# Patient Record
Sex: Male | Born: 1970 | Race: White | Hispanic: No | Marital: Single | Smoking: Current every day smoker
Health system: Southern US, Community
[De-identification: ages and names within clinical notes are randomized; demographics above are authoritative.]

## PROBLEM LIST (undated history)

## (undated) DIAGNOSIS — K5792 Diverticulitis of intestine, part unspecified, without perforation or abscess without bleeding: Secondary | ICD-10-CM

## (undated) DIAGNOSIS — N200 Calculus of kidney: Secondary | ICD-10-CM

## (undated) HISTORY — PX: BACK SURGERY: SHX140

## (undated) HISTORY — PX: KNEE ARTHROSCOPY: SUR90

## (undated) HISTORY — PX: HERNIA REPAIR: SHX51

---

## 1999-11-19 ENCOUNTER — Emergency Department (HOSPITAL_COMMUNITY): Admission: EM | Admit: 1999-11-19 | Discharge: 1999-11-19 | Payer: Self-pay | Admitting: *Deleted

## 2006-06-15 ENCOUNTER — Ambulatory Visit: Payer: Self-pay | Admitting: Infectious Diseases

## 2006-06-15 ENCOUNTER — Inpatient Hospital Stay (HOSPITAL_COMMUNITY): Admission: EM | Admit: 2006-06-15 | Discharge: 2006-06-17 | Payer: Self-pay | Admitting: Emergency Medicine

## 2006-06-27 ENCOUNTER — Ambulatory Visit: Payer: Self-pay | Admitting: Gastroenterology

## 2006-06-27 ENCOUNTER — Inpatient Hospital Stay (HOSPITAL_COMMUNITY): Admission: AD | Admit: 2006-06-27 | Discharge: 2006-07-04 | Payer: Self-pay | Admitting: Gastroenterology

## 2006-06-28 ENCOUNTER — Encounter (INDEPENDENT_AMBULATORY_CARE_PROVIDER_SITE_OTHER): Payer: Self-pay | Admitting: Specialist

## 2006-07-06 ENCOUNTER — Ambulatory Visit: Payer: Self-pay | Admitting: Internal Medicine

## 2006-08-08 ENCOUNTER — Ambulatory Visit: Payer: Self-pay | Admitting: Gastroenterology

## 2006-08-08 LAB — CONVERTED CEMR LAB
AST: 21 units/L (ref 0–37)
Albumin: 3.5 g/dL (ref 3.5–5.2)
Alkaline Phosphatase: 59 units/L (ref 39–117)
BUN: 18 mg/dL (ref 6–23)
Basophils Absolute: 0.1 10*3/uL (ref 0.0–0.1)
Creatinine, Ser: 1.1 mg/dL (ref 0.4–1.5)
GFR calc Af Amer: 97 mL/min
HCT: 47.4 % (ref 39.0–52.0)
MCHC: 34.6 g/dL (ref 30.0–36.0)
MCV: 88.1 fL (ref 78.0–100.0)
Monocytes Relative: 1.2 % — ABNORMAL LOW (ref 3.0–11.0)
Neutrophils Relative %: 93.5 % — ABNORMAL HIGH (ref 43.0–77.0)
Platelets: 303 10*3/uL (ref 150–400)
RBC: 5.38 M/uL (ref 4.22–5.81)
RDW: 13.8 % (ref 11.5–14.6)
Sodium: 140 meq/L (ref 135–145)
Total Bilirubin: 1 mg/dL (ref 0.3–1.2)
Total Protein: 7 g/dL (ref 6.0–8.3)

## 2006-09-21 ENCOUNTER — Ambulatory Visit: Payer: Self-pay | Admitting: Gastroenterology

## 2006-10-03 ENCOUNTER — Ambulatory Visit: Payer: Self-pay | Admitting: Gastroenterology

## 2006-10-03 LAB — CONVERTED CEMR LAB
Basophils Absolute: 0 10*3/uL (ref 0.0–0.1)
Bilirubin, Direct: 0.2 mg/dL (ref 0.0–0.3)
Eosinophils Absolute: 0 10*3/uL (ref 0.0–0.6)
Eosinophils Relative: 0.3 % (ref 0.0–5.0)
HCT: 44.4 % (ref 39.0–52.0)
Lymphocytes Relative: 11.8 % — ABNORMAL LOW (ref 12.0–46.0)
MCHC: 34 g/dL (ref 30.0–36.0)
MCV: 94.3 fL (ref 78.0–100.0)
Neutro Abs: 8.6 10*3/uL — ABNORMAL HIGH (ref 1.4–7.7)
Neutrophils Relative %: 84.9 % — ABNORMAL HIGH (ref 43.0–77.0)
Total Protein: 7 g/dL (ref 6.0–8.3)
WBC: 10.1 10*3/uL (ref 4.5–10.5)

## 2006-10-07 ENCOUNTER — Emergency Department (HOSPITAL_COMMUNITY): Admission: EM | Admit: 2006-10-07 | Discharge: 2006-10-07 | Payer: Self-pay | Admitting: Emergency Medicine

## 2006-10-13 ENCOUNTER — Ambulatory Visit: Payer: Self-pay | Admitting: Gastroenterology

## 2006-10-17 ENCOUNTER — Emergency Department (HOSPITAL_COMMUNITY): Admission: EM | Admit: 2006-10-17 | Discharge: 2006-10-17 | Payer: Self-pay | Admitting: Emergency Medicine

## 2006-11-09 ENCOUNTER — Ambulatory Visit: Payer: Self-pay | Admitting: Gastroenterology

## 2006-11-09 LAB — CONVERTED CEMR LAB
Basophils Relative: 0.9 % (ref 0.0–1.0)
Eosinophils Relative: 1.2 % (ref 0.0–5.0)
HCT: 47 % (ref 39.0–52.0)
Hemoglobin: 16 g/dL (ref 13.0–17.0)
Monocytes Absolute: 0.5 10*3/uL (ref 0.2–0.7)
Neutrophils Relative %: 67.8 % (ref 43.0–77.0)
RDW: 12.9 % (ref 11.5–14.6)
WBC: 6.3 10*3/uL (ref 4.5–10.5)

## 2006-12-05 ENCOUNTER — Emergency Department (HOSPITAL_COMMUNITY): Admission: EM | Admit: 2006-12-05 | Discharge: 2006-12-05 | Payer: Self-pay | Admitting: Emergency Medicine

## 2007-03-07 ENCOUNTER — Emergency Department (HOSPITAL_COMMUNITY): Admission: EM | Admit: 2007-03-07 | Discharge: 2007-03-07 | Payer: Self-pay | Admitting: Emergency Medicine

## 2007-07-06 ENCOUNTER — Emergency Department (HOSPITAL_COMMUNITY): Admission: EM | Admit: 2007-07-06 | Discharge: 2007-07-06 | Payer: Self-pay | Admitting: Emergency Medicine

## 2007-08-17 ENCOUNTER — Emergency Department (HOSPITAL_COMMUNITY): Admission: EM | Admit: 2007-08-17 | Discharge: 2007-08-17 | Payer: Self-pay | Admitting: Emergency Medicine

## 2007-10-16 ENCOUNTER — Emergency Department (HOSPITAL_COMMUNITY): Admission: EM | Admit: 2007-10-16 | Discharge: 2007-10-16 | Payer: Self-pay | Admitting: Emergency Medicine

## 2007-10-22 ENCOUNTER — Emergency Department (HOSPITAL_COMMUNITY): Admission: EM | Admit: 2007-10-22 | Discharge: 2007-10-22 | Payer: Self-pay | Admitting: Emergency Medicine

## 2008-04-10 ENCOUNTER — Emergency Department (HOSPITAL_COMMUNITY): Admission: EM | Admit: 2008-04-10 | Discharge: 2008-04-11 | Payer: Self-pay | Admitting: Emergency Medicine

## 2008-08-21 ENCOUNTER — Emergency Department (HOSPITAL_COMMUNITY): Admission: EM | Admit: 2008-08-21 | Discharge: 2008-08-21 | Payer: Self-pay | Admitting: Emergency Medicine

## 2010-06-24 ENCOUNTER — Inpatient Hospital Stay (HOSPITAL_COMMUNITY)
Admission: EM | Admit: 2010-06-24 | Discharge: 2010-06-25 | Payer: Self-pay | Source: Home / Self Care | Attending: Internal Medicine | Admitting: Internal Medicine

## 2010-09-28 LAB — CULTURE, BLOOD (ROUTINE X 2): Culture  Setup Time: 201112082124

## 2010-09-28 LAB — DIFFERENTIAL
Basophils Relative: 1 % (ref 0–1)
Eosinophils Absolute: 0.5 10*3/uL (ref 0.0–0.7)
Lymphs Abs: 2.4 10*3/uL (ref 0.7–4.0)
Neutrophils Relative %: 53 % (ref 43–77)

## 2010-09-28 LAB — GLUCOSE, CAPILLARY
Glucose-Capillary: 113 mg/dL — ABNORMAL HIGH (ref 70–99)
Glucose-Capillary: 122 mg/dL — ABNORMAL HIGH (ref 70–99)

## 2010-09-28 LAB — COMPREHENSIVE METABOLIC PANEL
ALT: 26 U/L (ref 0–53)
AST: 22 U/L (ref 0–37)
Albumin: 3.2 g/dL — ABNORMAL LOW (ref 3.5–5.2)
BUN: 18 mg/dL (ref 6–23)
CO2: 25 mEq/L (ref 19–32)
Calcium: 8.7 mg/dL (ref 8.4–10.5)
Calcium: 8.8 mg/dL (ref 8.4–10.5)
Creatinine, Ser: 1.02 mg/dL (ref 0.4–1.5)
GFR calc Af Amer: >60 mL/min (ref >60)
GFR calc non Af Amer: >60 mL/min (ref >60)
Glucose, Bld: 114 mg/dL — ABNORMAL HIGH (ref 70–99)
Sodium: 138 mEq/L (ref 135–145)
Total Protein: 6.2 g/dL (ref 6.0–8.3)

## 2010-09-28 LAB — C-REACTIVE PROTEIN: CRP: 0.1 mg/dL — ABNORMAL LOW (ref <0.6)

## 2010-09-28 LAB — URINALYSIS, ROUTINE W REFLEX MICROSCOPIC
Protein, ur: NEGATIVE mg/dL
Urobilinogen, UA: 0.2 mg/dL (ref 0.0–1.0)

## 2010-09-28 LAB — SEDIMENTATION RATE: Sed Rate: 7 mm/hr (ref 0–16)

## 2010-09-28 LAB — CBC
HCT: 42.8 % (ref 39.0–52.0)
Hemoglobin: 15.3 g/dL (ref 13.0–17.0)
MCH: 29.1 pg (ref 26.0–34.0)
MCH: 29.7 pg (ref 26.0–34.0)
MCHC: 34.6 g/dL (ref 30.0–36.0)
MCHC: 35.7 g/dL (ref 30.0–36.0)
MCV: 82.9 fL (ref 78.0–100.0)
Platelets: 257 10*3/uL (ref 150–400)
RBC: 5.09 MIL/uL (ref 4.22–5.81)

## 2010-09-28 LAB — FECAL LACTOFERRIN, QUANT: Fecal Lactoferrin: POSITIVE

## 2010-09-28 LAB — OVA AND PARASITE EXAMINATION

## 2010-09-28 LAB — HEMOCCULT GUIAC POC 1CARD (OFFICE): Fecal Occult Bld: POSITIVE

## 2010-11-02 LAB — URINALYSIS, ROUTINE W REFLEX MICROSCOPIC
Glucose, UA: NEGATIVE mg/dL
Ketones, ur: NEGATIVE mg/dL
Nitrite: NEGATIVE
Protein, ur: NEGATIVE mg/dL

## 2010-11-02 LAB — RAPID URINE DRUG SCREEN, HOSP PERFORMED
Benzodiazepines: NOT DETECTED
Tetrahydrocannabinol: NOT DETECTED

## 2010-11-30 NOTE — Letter (Signed)
Dec 05, 2006    Connor Mcpherson  [address]  [place], [place]  [address]   RE:  Connor Mcpherson, Connor Mcpherson  MRN:  [id number]  /  DOB:  1970/08/18   Dear Mr. Regala:   I am withdrawing from your gastroenterology care at this time because of  non-compliance and failure to keep your office appointments.  It is not  feasible for [place] to manage your rather severe inflammatory bowel disease  in this situation especially with immunosuppressive therapy which  requires close monitorization.  This is not the best situation for you  or for your physicians.  I would suggest that you go to [facility]  here in [place] to get your medications and followup or else return  to care at [facility] where you  have previously been seen.   We will not renew your medications at this time, especially with the  risk of immunosuppressive therapy unsupervised care.  We would be glad  to forward you the name of any other competent physicians in this area  you so need.    Sincerely,      Vania Rea. Jarold Motto, MD, Caleen Essex, FAGA  Electronically Signed    DRP/MedQ  DD: 12/05/2006  DT: 12/05/2006  Job #: [id number]

## 2010-12-03 NOTE — Assessment & Plan Note (Signed)
[facility]                         GASTROENTEROLOGY OFFICE NOTE   Connor Mcpherson                       MRN:          [id number]  DATE:10/13/2006                            DOB:          01-09-1971    Connor Mcpherson is doing well and denies symptoms.  He is on all of his medicines,  but relates that he is not on Imuran.  He has been having to taper his  prednisone down to 15 mg a day, and has used some Lialda 4.8 g a day  along with Rowasa 4 g suppositories at night, and p.r.n. Canasa 1 g  suppository.  He denies abdominal pain or systemic complaints or  complications from his medications.   Vital signs were all stable.  General physical exam was not repeated.   ASSESSMENT:  Connor Mcpherson's colitis appears to be doing well on regular  aminosalicayte  therapy.  He has been steroid dependent now for over a  year, and needs to get on immunosuppressants.   RECOMMENDATIONS:  1. I have given him samples of Imuran to take 100 mg a day, and we      will check CBC in 2 weeks with office followup in 1 month.  2. Continue all medications listed above with enema and Canasa      suppository taper as tolerated.     Vania Rea. Jarold Motto, MD, Caleen Essex, FAGA  Electronically Signed    DRP/MedQ  DD: 10/13/2006  DT: 10/13/2006  Job #: [id number]

## 2010-12-03 NOTE — H&P (Signed)
Connor Mcpherson, Connor Mcpherson                ACCOUNT NO.:  [id number]   MEDICAL RECORD NO.:  [id number]          PATIENT TYPE:  INP   LOCATION:  6737                         FACILITY:  [facility]   PHYSICIAN:  Vania Rea. Jarold Motto, MD, Caleen Essex, FAGA DATE OF BIRTH: [birth year]   DATE OF ADMISSION:  06/27/2006  DATE OF DISCHARGE:                              HISTORY & PHYSICAL   CHIEF COMPLAINT:  Bloody diarrhea.   HISTORY OF PRESENT ILLNESS:  Mr. Aymond is a 40 year old Native American  gentleman.  Beginning around February 2007, he developed bloody diarrhea  associated with tenesmus and crampy pain, urgency of bowel movements  after eating.  At that time, he had been following a high-protein diet  including raw eggs and a lot of milk.  Incidentally, he had been  following this for at least a year, but the GI symptoms did not develop  until earlier this year.  Medical staff at the prison apparently  diagnosed him with a bacterial colitis, and he was treated with  antibiotics.  The symptoms failed to improve, and finally in around  August of this year, he was shipped over for a colonoscopy to [facility] where he was diagnosed with ulcerative colitis.  We do not have the  records of this colonoscopy available.   Following the diagnosis with [facility], the patient was started on large-dose  prednisone starting at 40 mg a day and eventually over the next several  weeks tapered to 5 mg a day.  He was also started on Asacol 6 a day.  He  says that the diarrhea continued, although it is abated maybe a little  bit, but he still continued to have bloody stools.  The patient was  released from prison about 2 weeks ago.  He showed up at the ER at [facility] on December 1 and had an overnight admission to the teaching  service.  They increased his Asacol from 800 mg twice daily to 1.2 grams  three times daily.  They also put him on a very rapid taper of  prednisone 40 mg December 2, 20 mg December 3, 10  mg December 4, and 5  mg December 5, to be stopped thereafter.  The patient was also treated  during that hospitalization with cortisone enemas.   The patient has failed to have any improvement in his some bloody  stools, rectal spasm and general sense of weakness.  He worked all day  yesterday outside, and this morning it was very difficult for him to get  up because of profound fatigue. His mother is a patient of Dr. Sheryn Bison, and he was seen on a work-in basis today by Dr. Jarold Motto.  Dr. Jarold Motto is admitting the patient for inpatient management of his  colitis which has never really been under good control.  The patient has  never been on any immunosuppressants such as Imuran.  Note: Looking  through his labs from that admission in early December, he had blood  sugars reaching 189.  This was while he was getting Solu-Medrol 80 mg IV  every 8 hours.  The patient denies prior history of any glucose  intolerance.   The patient's weight has fluctuated.  Early this year before the onset  of his colitis, he weighed about 220 pounds.  He was very active while  incarcerated with a weight lifting team, and he went down to 190 pounds.  With the prednisone, his weight shot to 205, and he thinks he weighs  about a 193 pounds now.  He denies fevers and chills.  He has had no  problems with urination.  He denies any ocular or visual changes and has  not had any low back pain or extremity edema.  The patient has never  undergone any surgery.   ALLERGIES:  No known drug allergies.   CURRENT MEDICATIONS:  1. Asacol 1.2 grams three times daily.  2. Metamucil 1 packet daily.   PAST MEDICAL HISTORY:  Colitis.   SOCIAL HISTORY:  The patient recently released from less than a year of  incarceration.  It is unclear why he was incarcerated.  Presume that the  patient is on parole, although this a question was not asked the  patient.  He is single.  He is currently residing in [place].  He used  to smoke about one-half a pack a day for 15 years, but he quit about a  year ago.  He quit alcohol and any substances including marijuana and  cocaine in 2002..  The patient has applied for Medicaid; his status is  pending.   FAMILY HISTORY:  Mother is 50 and healthy.  Brother is healthy.  He has  maternal uncles who died of heart disease.  His father has a distant  history of Crohn's disease.  He has a 42 year old male child who is  healthy.  Maternal grandmother had breast cancer.  Distant maternal  great grandfather had rectal cancer.   REVIEW OF SYSTEMS:  MUSCULOSKELETAL: Has had some shoulder and left  ankle pain but no back pain.  GU: Urinary as noted above and negative  for positive symptoms.  DERMATOLOGIC:  No itching, occasional rash on  the extremities. RESPIRATORY: No cough, no hemoptysis.  No wheezing. No  history of asthma.  NEUROLOGIC:  No headaches.  No diplopia or blurry  vision.  PSYCHIATRIC: No suicidal or homicidal ideation.  No history of  depression.   LABORATORY:  All pending at this time and include full chemistries, CBC.  Note that the patient did have extensive stool studies for C. difficile  , EHEC toxin, stool cultures, Giardia and Cryptosporidium. All of these  were negative.  The one positive test was a fecal lactoferrin.   PHYSICAL EXAMINATION:  GENERAL:  The patient is a pleasant, very  cooperative, healthy and strong-appearing man.  He is not in any current  distress.  HEENT: Sclerae are nonicteric.  Conjunctiva is pink.  Extraocular  movements are intact.  Oropharynx is moist and clear.  There is no  yeast.  NECK:  No masses, no thyromegaly.  PULMONARY:  Chest is clear to auscultation and percussion bilaterally.  There is no dyspnea, no cough.  CARDIOVASCULAR: Regular rate and rhythm.  No murmurs, rubs or gallops.  ABDOMEN:  The abdomen is soft with mild tenderness in the left lower quadrant.  No guarding or rebound.  No masses, no  hepatosplenomegaly, no  bruits.  No hernias.  RECTAL/GU:  Exams were deferred.  EXTREMITIES: No cyanosis, no clubbing or edema.  NEUROLOGIC: He is alert and oriented x3.  He  moves all fours without  problems.  He walks without assistance.  Grip and pedal strength are 5/5  bilaterally.  PSYCHIATRIC: The patient is cooperative.  Does not appear depressed.  He  has excellent insight into his condition.  In fact, he is able to speak  intelligently on the subject of colitis, having a lot of reading and  research into these subject.  DERMATOLOGIC:  No rash, no bruising, no petechiae.   IMPRESSION:  1. Ulcerative colitis with severe symptoms, daily frequent bloody      stools with symptoms failing to improve on steroids as well as      large doses of mesalamine.  2. Dehydration associated with the diarrhea.  3. History of glucose intolerance associated with the use of large      doses of Solu-Medrol during his recent hospitalization.   PLAN:  Admit the patient for IV steroids, flexible sigmoidoscopy and  full liquid diet.  We will plan to continue Asacol, add Solu-Medrol and  a more judicious dosing than previously, and also restart rectal therapy  with cortisol enemas and release of suppositories.      Jennye Moccasin, PA-C      Vania Rea. Jarold Motto, MD, Caleen Essex, FAGA  Electronically Signed    SG/MEDQ  D:  06/27/2006  T:  06/27/2006  Job:  [id number]

## 2010-12-03 NOTE — Assessment & Plan Note (Signed)
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  He was discharged with severe colitis  on a very rapid steroid taper and was seen in my office and admitted to  the hospital with bloody diarrhea.  During his hospitalization on our  service he underwent colonoscopy showing mostly left-sided colitis, with  rather severe inflammation.  Was placed on a longer prednisone taper and  started on Imuran 150 mg a day and Asacol 1.2 g three times a day.  He  missed his initial followup office appointment but returns today saying  that he is doing well.  He is having minimal rectal bleeding using  nightly Canasa 1 gm suppositories.  He denies abdominal cramping,  systemic complaints, upper GI hepatobiliary problems, fever, chills, or  other infectious disease-type symptomatology.   Additional problems I will review in his discharge summary were acute  renal insufficiency, time of admission secondary to dehydration and mild  hyperglycemia.  He had a rather significant weight loss during his  procedure.  He also suffered from chronic anxiety but was not discharged  on any antisedative medications except for p.r.n. Ambien at bedtime.   EXAMINATION:  Today shows him to weigh 209 pounds and blood pressure is  122/82.  Pulse was 60 and regular.  He is a healthy, non toxic black  male appearing his stated age.  CHEST:  Clear.  There were no murmur, gallops or rubs and he was in a  regular rhythm.  I could not appreciate hepatosplenomegaly, abdominal mass, but there was  some tenderness over the sigmoid colon to deep palpation.  Bowel  sounds  were normal.  PERIPHERAL EXTREMITIES:  Unremarkable.  MENTAL STATUS:  Clear.  RECTAL:  Deferred.   ASSESSMENT:  Connor Mcpherson has left-sided ulcerative colitis which has been  relapsing over the last year per his detailed past history in the chart.  He seems to be responding well to therapy at this time and we will  continue him on Imuran and oral aminosalicylates.  He says he ran out of  Asacol 2 weeks ago and has not been on Asacol since that time.   RECOMMENDATIONS:  1. Continue Imuran 150 mg a day and check CBC and metabolic profile,      magnesium, and electrolytes.  2. Start Lialda 2.4 g a day and continue Canasa suppositories until      his rectal bleeding has ceased, then we will go to every other      night on his Canasa suppositories for 2 weeks then stop.  3. Continue prednisone taper slowly.  4. Continue multivitamins.  I have also added daily folic acid 1 mg  5. Office followup in one month's time.  6. Avoid NSAIDs  which she is taking for headache, and I have      prescribed limited Darvocet N-100 every 6-8 hours in the interim.     Vania Rea. Jarold Motto, MD, Caleen Essex, FAGA  Electronically Signed    DRP/MedQ  DD: 08/08/2006  DT: 08/08/2006  Job #: [id number]

## 2010-12-03 NOTE — Assessment & Plan Note (Signed)
[facility]                         GASTROENTEROLOGY OFFICE NOTE   Connor Mcpherson, Connor Mcpherson                       MRN:          [id number]  DATE:09/21/2006                            DOB:          Mar 18, 1971    Connor Mcpherson continues to have some diarrhea and rectal bleeding approximately  three a day.  Again it is unclear as to how exactly he is using his  medications.  He, I think, is taking his Imuran 150 mg a day, prednisone  20 mg a day, and Asacol at varying doses.  He could not keep his  suppositories on a regular basis.  He now is on Medicaid and is able to  get his medications.  He denies systemic complaints.  He works at  [employer], 12-hour shifts, and finds this hard on his body and his  general medical status, but he seems to be holding up fairly well.  He  denies nausea and vomiting, fever, chills, any cardiopulmonary or system  complaints.  He has known rather severe ulcerative colitis.   PHYSICAL EXAMINATION:  VITAL SIGNS:  He weighs 211 pounds, blood  pressure 118/72, pulse was 80 and regular.  GENERAL:  General physical examination was not performed.   RECOMMENDATIONS:  1. Continue Imuran which should be having some effect within the next      month.  I have tried to encourage the patient that hopefully he      will get response from immunosuppressive therapy and we can back      off on his prednisone.  2. Continue prednisone 20 mg a day.  3. Canasa Suppositories in the morning.  4. Rowasa enemas at bedtime.  5. Discontinue Asacol and try Lialda 4.8 grams p.o. once a day.  6. GI follow-up in 2 weeks' time.  7. If he is still having problems, we may need to repeat his      colonoscopy and obtain biopsies for CMV.  We may also need to      entertain Humira or Remicade administration.     Vania Rea. Jarold Motto, MD, Caleen Essex, FAGA  Electronically Signed    DRP/MedQ  DD: 09/21/2006  DT: 09/21/2006  Job #: [id number]

## 2010-12-03 NOTE — Discharge Summary (Signed)
Connor Mcpherson, Connor Mcpherson NO.:  [id number]   MEDICAL RECORD NO.:  [id number]          PATIENT TYPE:  INP   LOCATION:  6737                         FACILITY:  [facility]   PHYSICIAN:  Rachael Fee, MD   DATE OF BIRTH:  [birth year]   DATE OF ADMISSION:  06/27/2006  DATE OF DISCHARGE:  07/04/2006                               DISCHARGE SUMMARY   ADMITTING DIAGNOSES:  The list of this patient's admitting diagnoses is:  1. Ulcerative colitis.  Symptoms are severe and consist of daily      frequent bloody stools and tenesmus as well as abdominal cramps.      Symptoms persistent despite mesalamine therapy.  2. Dehydration associated with colitis.  3. History of glucose intolerance associated with use of large doses      of Solu-Medrol during a recent brief hospitalization.   DISCHARGE DIAGNOSES:  1. Severe ulcerative colitis, symptoms coming under improved control      with intravenous steroids, initiation of Imuran as well as      mesalamine rectal medications.  2. Acute renal insufficiency on admission, resolved with hydration and      correction of dehydration.  3. Hyperglycemia.  Serum blood glucose maximum was 156.   PROCEDURES:  Flexible sigmoidoscopy by Connor Mcpherson on June 28, 2006.  She reached 40 cm into the colon.  Activity level was moderate  with superficial ulcers present.  Overall the severity was considered  moderately severe.  Biopsies obtained for herpes and CMV.  Pathology on  the biopsies showed chronic active colitis consistent with inflammatory  bowel disease.  No evidence for any viral changes, i.e., no CMV or  herpes-like changes.   CONSULTATIONS:  None.   BRIEF HISTORY:  Connor Mcpherson is a 40 year old native-American gentleman.  He started having colitis symptoms about February of 2007.  The symptoms  were bloody diarrhea with tenesmus and crampy pain and bowel urgency  following meals.  Initially, staff at the prison where he was  incarcerated thought that these were coming from his eating of raw eggs  and that he may have picked up an infection.  He failed to improve  following a course of antibiotics.  Ultimately, in the summer of 2007,  he was sent to [facility] and had colonoscopy and was diagnosed  with ulcerative colitis.  We do not have copies of that colonoscopy.   Medications used to treat this new diagnosis of ulcerative colitis  included prednisone 40 mg a day which was tapered over several weeks to  5 mg a day.  He was also taking Asacol, a total of 6 pills a day.  The  diarrhea really did not improve much, he continued to have bloody  stools.  Patient was released from prison 2 weeks ago.  On December 1,  he went to the ER at [facility] and had an overnight admission to the  teaching service.  At that time his Asacol was increased to 1.2 g 3x a  day.  He had been on 800 mg 3x a day.  They  also gave him a very rapid  taper of prednisone starting at 40 mg on December 2 and tapering down to  5 mg on December 5 and stopping on December 6.   Patient again failed to improve following the increase in his Asacol and  this rapid prednisone taper and he was self referred to Connor Mcpherson as his mother is a patient of Connor Mcpherson.  He was seen in  the office December 11 at Connor Mcpherson and was admitted from the  office to the hospital.  Note that in reviewing records from his brief  hospitalization of early December, his blood sugars had reached 189  while he was receiving 80 mg of Solu-Medrol every 8 hours.  He also  reported weight loss, from 220 down to 190 pounds.  Appetite has been  fair but symptoms are definitely exacerbated by eating.   LABORATORIES:  Hepatitis B surface antigen, hepatitis B surface  antibody, hepatitis C antibody negative.  Hepatitis C quantitative  testing is pending but would assume these will be insignificant given he  is hepatitis C antibody negative.  HIV  testing non reactive.  Herpes  cultures from left colon biopsy negative.  Viral cultures from the left  colon are pending at this time.  White blood cell count 9.3 on  admission, 13.7 the 16th of December, this after having received IV Solu-  Medrol for several days.  Hemoglobin 15.2, hematocrit 44.9.  MCV 88.1.  Platelets 268,000.  The differential of December 16 did show a left  shift with increased neutrophils.  The erythrocyte sedimentation rate 4.  PT 13.6, INR 1.0, PTT 31.  Sodium 137, potassium 3.9.  Chloride 103, CO2  of 26.  Glucose range 105-156.  BUN initially 24, corrected to 11.  Creatinine on admission 1.6, corrected to 1.1.  Albumin 3.1.  Total  bilirubin 1.1, alkaline phosphatase 60, AST 23, ALT 19.   RADIOLOGY:  Two view chest on December 11 showed no active  cardiopulmonary disease and unremarkable visualized skeletal structures.   HOSPITAL COURSE:  1. Ulcerative colitis.  On admission he was started on Solu-Medrol 25      mg twice daily, Asacol 0.12 g 3x daily, Rowasa enemas at h.s. and      Canasa suppositories during the daytime.  Patient underwent      flexible sigmoidoscopy on December 12 with findings described in      full above.  Connor Mcpherson started Imuran 150 mg daily on the 13th of      December and advanced him to a low-residue diet.  His Asacol was      increased to 1.6 g q.i.d.  The Rowasa enemas were discontinued and      its place were Proctofoam, enemas at h.s.  Patient's diet was      slowly diet to a low-residue diet.  On the 17th of December, the      Solu-Medrol was discontinued and he started prednisone 20 mg twice      daily.  He did continue p.o. Asacol, the Imuran, and ultimately at      discharge, the Asacol was discontinued and the new medication,      Imuran, was continued.   Over the course of this 7-day hospitalization, the patient's symptoms  improved significantly.  He had much less in the way of visible blood but was still seeing some  small amounts of blood with some of his bowel  movements.  The tenesmus had  resolved and he was eating a low-residue  diet without adverse repercussions.   Plans for a prednisone taper included 40 mg for 10 days, 30 mg for 10  days, 20 mg for 10 days tapering ultimately down to 5 mg a day for 10  days.  Patient has a follow-up appointment with Dr. Jarold Motto on January  3.   1. Difficult social situation.  Patient has recently been released      from prison.  It is not quite clear whether he has been exonerated      or whether he is on parole.  In any event, he has not gotten a wage-      earning job and has had difficulty paying for medications.  Patient      was provided with a 3-day supply of the prednisone and the      azathioprine as well as Canasa suppositories.  He was also supplied      with a 81-month supply prescription with refills for all discharge      medications.   Patient has applied for Medicaid and apparently this is in the works,  but he does not yet have a Medicaid card.   1. Anxiety.  Patient had significant anxiety over his health and had      many questions, all of which were answered thoroughly.  He is quite      well-read on the subjective of ulcerative colitis, including      knowledge about low-residue diet.  Did note that the patient had      initiated fiber supplements in order to control diarrhea in the      past, and he was told that he should stop taking this Metamucil.      All in all though, he is hypervigilent about managing his disease.      Hopefully as his symptoms come under control, his anxiety will      lessen and expect anxiety to lessen as he has time out of prison in      a relaxed environment.  Patient's family is supportive.   CONDITION AT DISCHARGE:  Stable and improved.   MEDICATIONS AT DISCHARGE:  1. Prednisone 40 mg for 10 days, 30 mg for 10 days, 20 mg for 10 days,      15 mg for 10 days, 10 mg for 10 days, 5 mg for 10 days.  2.  Azathioprine 150 mg once daily.  3. Canasa suppositories at h.s.  4. Ambien 10 mg one-half to one p.o. q.h.s. at bedtime.  Patient was      also told that he could use Tylenol PM if he preferred as this may      be a cheaper alternative to the Ambien.      Jennye Moccasin, PA-C      Rachael Fee, MD  Electronically Signed    SG/MEDQ  D:  07/04/2006  T:  07/05/2006  Job:  [id number]   cc:   Vania Rea. Jarold Motto, MD, Caleen Essex, FAGA

## 2010-12-03 NOTE — Assessment & Plan Note (Signed)
[facility]                         GASTROENTEROLOGY OFFICE NOTE   Connor, Mcpherson                       MRN:          [id number]  DATE:06/27/2006                            DOB:          1970/11/20    Mr. Connor Mcpherson is a 40 year old black male sent to my office today,  apparently from [facility] for evaluation of bloody  diarrhea.   He was hospitalized on June 17, 2006 with bloody diarrhea, having 8  to 10 bowel movements a day and was given a short course of p.o.  prednisone therapy and was discharged on Asacol at 1.2 grams t.i.d.   I have no past records for review, but apparently he was diagnosed with  ulcerative colitis when he was a prisoner at [employer].  He apparently saw a Dr. Baldo Daub through Dr. Wonda Amis at [facility] and had a colonoscopy that showed left-sided colitis.  He  apparently was on prednisone and aminosalicylates for a short period of  time, his prednisone was discontinued, again has had a flare over the  last 6-8 weeks.  During his hospitalization at [facility], he was supposed to  follow up with Dr. Ardyth Harps, but the patient relates he could not get  an appointment, despite numerous calls.   The patient is having crampy abdominal pain, general malaise, fatigue,  arthralgias in his knees, nausea, decreased p.o. intake, incontinency,  and some nocturnal diarrhea.  He has lost 25 pounds over the last 9  months.  Patient denies abuse of alcohol or cigarettes.  Illicit drug  use is questionable.  He is a single, has a 12th grade education.   PAST MEDICAL HISTORY:  Otherwise noncontributory.   EXAMINATION:  VITAL SIGNS:  Today shows him to be 5 feet, 8 inches tall,  weighs 200 pounds.  Blood pressure 110/70 and pulse was 76 and regular.  He was mildly dehydrated, but I could not appreciate stigmata of chronic  liver disease, skin rashes, swollen joints or lymphadenopathy.  MENTAL STATUS:   Clear.  CHEST:  Clear to percussion and auscultation.  He was in a regular  rhythm without murmurs, gallops or rubs.  ABDOMEN:  Showed no organomegaly, masses, with some slight tenderness of  the sigmoid colon to deep palpation.  Bowel sounds were normal.  EXTREMITIES:  Peripheral extremities were unremarkable.  RECTAL:  Deferred.   ASSESSMENT:  [name] has severe ulcerative colitis and needs  hospitalization and intravenous steroids, perhaps Remicade therapy.   RECOMMENDATIONS:  We will hospitalize this patient and start him on IV  prednisone, IV fluids and continue high-dose oral aminosalicylates.  He  needs a flexible sigmoidoscopy, mucosal biopsy to exclude cytomegalic  virus.  May need Remicade infusions additionally, but this may be a  problem, since he is uninsured.  During his hospitalization, there  should be some  contact with primary care, per what appears to be inadequate followup  from his recent hospitalization, also inadequate treatment of his  colitis.     Vania Rea. Jarold Motto, MD, Caleen Essex, FAGA  Electronically Signed    DRP/MedQ  DD: 06/27/2006  DT: 06/27/2006  Job #: [id number]   cc:   Peggye Pitt, M.D.  [facility]  Hedwig Morton. Juanda Chance, MD

## 2010-12-03 NOTE — Discharge Summary (Signed)
NAMEJEROMY, Connor Mcpherson                ACCOUNT NO.:  [id number]   MEDICAL RECORD NO.:  [id number]          PATIENT TYPE:  INP   LOCATION:  5739                         FACILITY:  [facility]   PHYSICIAN:  Peggye Pitt, M.D. DATE OF BIRTH:  [birth year]   DATE OF ADMISSION:  DATE OF DISCHARGE:  06/17/2006                               DISCHARGE SUMMARY   DISCHARGE DIAGNOSIS:  Ulcerative colitis.   DISCHARGE MEDICATIONS:  1. Asacol 400 mg; take three tablets by mouth three times daily.  2. Prednisone taper; take 40 mg on December 2nd, 20 mg on December      3rd, 10 mg on December 4th, and 5 mg on December 5th.   DISPOSITION AND FOLLOWUP:  The patient was discharged from the hospital  where he received an increase in his mesalamine to 1200 mg three times a  day.  Diarrhea improved in that he was having 2-3 bowel movements a day  instead of 8-10 bowel movements that he had prior to admission.  Bowel  movements continued to be both bloody and mucus.  All stool studies and  lab results for infectious causes were negative.  The patient will  follow up in outpatient clinic with Dr. Peggye Pitt, where he will  need to be evaluated for ongoing treatment of his ulcerative colitis and  assessed for improvement in number of bowel movements and in decreased  amount of bloody bowel movements.  He will also need to be assessed for  repeat colonoscopy in the future.   PROCEDURES PERFORMED:  None.   CONSULTATIONS:  None.   BRIEF ADMISSION HISTORY AND PHYSICAL:  This is a 40 year old male who  was diagnosed with ulcerative colitis approximately eight months ago at  [facility].  Prior to diagnosis he was having diarrhea and  hematochezia.  He had a colonoscopy done at that time and was diagnosed  with ulcerative colitis and started on Asacol at a dose of 400 mg two  tablets three times a day.  Over the last eight months he continued to  have diarrhea on and off, but over the last month he became  progressively worse in that he lost 45 pounds and continued to have  approximately 8-10 bowel movements a day with both blood and mucus.  He  has been compliant with his medications.  His diarrhea is associated  with cramping, especially in the last 1-2 weeks.  He has had one flare  up in the last eight months around August 2007, that required  prednisone.  During the course of his treatment with prednisone he had a  questionable rash and itching that appeared one week after starting  prednisone.  He has had no fevers, no nausea, no vomiting, no chills,  and no night sweats.   PHYSICAL EXAMINATION:  VITAL SIGNS:  Temperature 97.9, blood pressure  108/64, pulse 51, respirations 18.  Oxygen saturation 100% on room air.  His weight is 91 kg.  GENERAL:  He is a well-appearing male with a normal body habitus in no  acute distress.  EYES:  Pupils were equal, round, and reactive  to light.  HEENT:  He had no tonsillar erythema or hypertrophy.  Oropharynx was  clear with moist mucous membranes.  NECK:  Supple with no thyromegaly.  RESPIRATORY:  Lungs were clear to auscultation bilaterally.  No crackles  or wheezes.  CARDIOVASCULAR:  Bradycardic with a regular rhythm.  No murmur.  ABDOMEN:  Soft, nontender, nondistended with normoactive bowel sounds.  EXTREMITIES:  Showed no cyanosis, clubbing, or edema.  SKIN:  Showed papular scarring over the upper chest and bilateral  shoulders secondary to a previous rash.  LYMPHATICS:  He had no palpable lymphadenopathy.  MUSCULOSKELETAL:  He had full range of motion in both bilateral upper  and lower extremities.  He did have pain with inversion of left ankle.  NEUROLOGIC:  He was alert and oriented times three.  Cranial nerves II-  XII were intact.  Strength was 5/5 in bilateral upper and lower  extremities.  Sensation was intact to both pain and light touch  throughout.  Reflexes were 2+ and symmetric throughout.  PSYCHIATRIC:  He had no suicidal or  homicidal ideations.  No depression.   ADMISSION LABS:  His stool was hemoccult positive.  Sodium 139,  potassium 4.1, chloride 107, bicarb 28.6, BUN 24, creatinine 1.2, blood  glucose 105.  White blood cell count 7.4, hemoglobin 17.2, hematocrit  50.6, platelets 240.  He had an abdominal x-ray that showed gas and  stool throughout the colon.  No evidence of colonic wall thickening.  No  small bowel distention and no acute findings.   HOSPITAL COURSE:  Diarrhea and cramping.  The patient had a history of  ulcerative colitis.  This was thought to be an acute exacerbation of his  ulcerative colitis.  His Asacol was increased to 1200 mg three times a  day where he showed improvement in the number and quantity of bowel  movements.  His bowel movements improved to 2-3 a day from the 8-10  prior to admission.  He was given IV fluids, D5 half-normal saline at  150 mL/hr.  Chemistries remained normal throughout the entire hospital  stay.  He was kept NPO initially and advanced to a clear diet and was  tolerating a solid diet prior to discharge.   DISCHARGE LABS:  Sodium 135, potassium 3.7, chloride 104, bicarb 26,  glucose 117, BUN 17, creatinine 1.1, calcium 8.2.  White blood cell  count 7.5, hemoglobin 15.5, hematocrit 45.1, platelets 220, MCV 86.4.  He continued to have a positive fecal occult blood.  Blood cultures were  negative.  C. difficile toxin was negative.  Stool was negative for  Shiga toxins 1 and 2.  Stool was negative for Giardia, was also negative  for Cryptosporidium.  Stool cultures also showed no Salmonella,  Shigella, Campylobacter, or Yersinia.   Again, patient is to follow up in the [facility] with  Dr. Ardyth Harps.  He will schedule his appointment for follow up with his  ulcerative colitis.  At that time, he will be evaluated for need of  colonoscopy, as well as, improvement in number and quantity of bowel   movements.    ______________________________  Connor Mcpherson    ______________________________  Peggye Pitt, M.D.    KR/MEDQ  D:  06/19/2006  T:  06/20/2006  Job:  [id number]   cc:   Outpatient Clinic

## 2011-03-25 ENCOUNTER — Emergency Department (HOSPITAL_BASED_OUTPATIENT_CLINIC_OR_DEPARTMENT_OTHER)
Admission: EM | Admit: 2011-03-25 | Discharge: 2011-03-25 | Disposition: A | Discharge status: Home / Self Care | Payer: Self-pay | Attending: Emergency Medicine | Admitting: Emergency Medicine

## 2011-03-25 ENCOUNTER — Encounter: Payer: Self-pay | Admitting: *Deleted

## 2011-03-25 DIAGNOSIS — IMO0002 Reserved for concepts with insufficient information to code with codable children: Secondary | ICD-10-CM | POA: Insufficient documentation

## 2011-03-25 DIAGNOSIS — M25569 Pain in unspecified knee: Secondary | ICD-10-CM | POA: Insufficient documentation

## 2011-03-25 DIAGNOSIS — W101XXA Fall (on)(from) sidewalk curb, initial encounter: Secondary | ICD-10-CM | POA: Insufficient documentation

## 2011-03-25 DIAGNOSIS — Y9229 Other specified public building as the place of occurrence of the external cause: Secondary | ICD-10-CM | POA: Insufficient documentation

## 2011-03-25 DIAGNOSIS — S8990XA Unspecified injury of unspecified lower leg, initial encounter: Secondary | ICD-10-CM

## 2011-03-25 DIAGNOSIS — F17200 Nicotine dependence, unspecified, uncomplicated: Secondary | ICD-10-CM | POA: Insufficient documentation

## 2011-03-25 NOTE — ED Notes (Signed)
Pt state that he stepped off the curb wrong yesterday and woke with left knee pain pt has had previous surgery on same knee

## 2011-03-25 NOTE — ED Provider Notes (Signed)
History     CSN: [id number] Arrival date & time: 03/25/2011  6:07 AM  Chief Complaint  Patient presents with  . Knee Pain   HPI Comments: Patient reports he was out drinking with some buddies last night when he stepped off a curb funny. Patient says he now has left knee pain. When he fell he related on his hands and knees. Patient has history of left knee surgery for torn cartilage in 1989. He has no other complaints and no other injuries. Patient is awake, alert, and oriented at this time.  Patient is a 40 y.o. male presenting with knee pain. The history is provided by the patient. No language interpreter was used.  Knee Pain This is a recurrent problem. The current episode started 3 to 5 hours ago. The problem occurs constantly. The problem has been gradually improving. The symptoms are aggravated by walking. The symptoms are relieved by nothing. He has tried nothing for the symptoms.    History reviewed. No pertinent past medical history.  Past Surgical History  Procedure Date  . Knee arthroscopy   . Hernia repair     History reviewed. No pertinent family history.  History  Substance Use Topics  . Smoking status: Current Everyday Smoker  . Smokeless tobacco: Never Used  . Alcohol Use: 7.2 oz/week    12 Cans of beer per week      Review of Systems  Musculoskeletal:       Left knee pain  All other systems reviewed and are negative.    Physical Exam  BP 127/85  Pulse 74  Temp(Src) 98 F (36.7 C) (Oral)  Resp 16  SpO2 98%  Physical Exam  Nursing note and vitals reviewed. Constitutional: He appears well-developed and well-nourished. No distress.  Musculoskeletal:       Left knee: He exhibits decreased range of motion and swelling. He exhibits no effusion, no ecchymosis, no erythema, normal alignment, no LCL laxity and no MCL laxity. No medial joint line, no lateral joint line, no MCL, no LCL and no patellar tendon tenderness noted.       Minimal diffuse left knee  swelling. Patient is able to ambulate with a slightly antalgic gait.    ED Course  Procedures  MDM Patient was examined and had very benign exam. He had no ligamentous instability. Patient was able to ambulate on his knee. He had suffered minimal trauma. Patient and I discussed that he can use over-the-counter medications for his pain. He was offered ibuprofen here but opted to take this at home just for reasons of cost. Patient is a Network engineer at [employer] and was worried about his ability to work this evening. We both agree that this is safe to do but I did provide him a note for this evening in case he was unable to tolerate work. Patient can continue to use rest, ice, elevation, and over-the-counter medications to control his pain. Patient was comfortable with plan and was discharged home in good condition.  Assessment: 40 year-old male with history of left knee surgery who presents today after minor fall and left knee sprain.  Plan: As per MDM above.      Cyndra Numbers, MD 03/25/11 [phone]

## 2011-04-07 LAB — URINALYSIS, ROUTINE W REFLEX MICROSCOPIC
Bilirubin Urine: NEGATIVE
Glucose, UA: NEGATIVE
Hgb urine dipstick: NEGATIVE
Ketones, ur: 15 — AB
Nitrite: NEGATIVE
Protein, ur: NEGATIVE
Specific Gravity, Urine: 1.033 — ABNORMAL HIGH
Urobilinogen, UA: 1
pH: 6

## 2011-04-07 LAB — DIFFERENTIAL
Basophils Absolute: 0.1
Basophils Relative: 1
Eosinophils Absolute: 0.1
Eosinophils Relative: 1
Lymphocytes Relative: 22
Lymphs Abs: 1.7
Monocytes Absolute: 0.5
Monocytes Relative: 7
Neutro Abs: 5.1
Neutrophils Relative %: 68

## 2011-04-07 LAB — CBC
HCT: 46.7
Hemoglobin: 16.3
MCHC: 34.9
MCV: 87.3
Platelets: 252
RBC: 5.35
RDW: 13.4
WBC: 7.5

## 2011-04-07 LAB — BASIC METABOLIC PANEL WITH GFR
CO2: 25
Chloride: 105
GFR calc Af Amer: >60
Glucose, Bld: 93
Sodium: 138

## 2011-04-07 LAB — OCCULT BLOOD X 1 CARD TO LAB, STOOL: Fecal Occult Bld: NEGATIVE

## 2011-04-07 LAB — BASIC METABOLIC PANEL
BUN: 23
Calcium: 9.2
Creatinine, Ser: 1.04
GFR calc non Af Amer: >60
Potassium: 3.6

## 2011-04-12 LAB — URINALYSIS, ROUTINE W REFLEX MICROSCOPIC
Ketones, ur: NEGATIVE
Nitrite: NEGATIVE
Urobilinogen, UA: 1
pH: 6

## 2011-04-12 LAB — CBC
HCT: 48.2
Hemoglobin: 16.5
MCV: 87.9
RBC: 5.48
WBC: 7.3

## 2011-04-12 LAB — DIFFERENTIAL
Basophils Absolute: 0
Basophils Relative: 1
Eosinophils Relative: 1
Lymphocytes Relative: 22
Monocytes Absolute: 0.7
Neutro Abs: 4.9

## 2011-04-12 LAB — RAPID URINE DRUG SCREEN, HOSP PERFORMED
Cocaine: POSITIVE — AB
Tetrahydrocannabinol: NOT DETECTED

## 2011-04-12 LAB — COMPREHENSIVE METABOLIC PANEL
Alkaline Phosphatase: 63
BUN: 18
CO2: 27
Chloride: 103
Creatinine, Ser: 1.08
GFR calc non Af Amer: >60
Potassium: 3.4 — ABNORMAL LOW
Total Bilirubin: 0.9

## 2011-04-22 LAB — DIFFERENTIAL
Basophils Absolute: 0
Basophils Relative: 1
Eosinophils Absolute: 0.1 — ABNORMAL LOW
Monocytes Relative: 6
Neutrophils Relative %: 60

## 2011-04-22 LAB — COMPREHENSIVE METABOLIC PANEL
ALT: 18
Alkaline Phosphatase: 72
BUN: 12
CO2: 25
Chloride: 107
Glucose, Bld: 86
Potassium: 4
Sodium: 139
Total Bilirubin: 0.9
Total Protein: 6.6

## 2011-04-22 LAB — URINALYSIS, ROUTINE W REFLEX MICROSCOPIC
Glucose, UA: NEGATIVE
Nitrite: NEGATIVE
Specific Gravity, Urine: 1.023
pH: 5

## 2011-04-22 LAB — CBC
HCT: 47.1
Hemoglobin: 16.6
RBC: 5.39
RDW: 13.4
WBC: 8.4

## 2011-04-29 LAB — URINALYSIS, ROUTINE W REFLEX MICROSCOPIC
Bilirubin Urine: NEGATIVE
Ketones, ur: NEGATIVE
Nitrite: NEGATIVE
Specific Gravity, Urine: 1.022
Urobilinogen, UA: 0.2

## 2011-04-29 LAB — URINE CULTURE: Culture: NO GROWTH

## 2011-04-29 LAB — URINE MICROSCOPIC-ADD ON

## 2011-05-31 ENCOUNTER — Emergency Department (HOSPITAL_BASED_OUTPATIENT_CLINIC_OR_DEPARTMENT_OTHER)
Admission: EM | Admit: 2011-05-31 | Discharge: 2011-05-31 | Disposition: A | Discharge status: Home / Self Care | Payer: Self-pay | Attending: Emergency Medicine | Admitting: Emergency Medicine

## 2011-05-31 ENCOUNTER — Encounter (HOSPITAL_BASED_OUTPATIENT_CLINIC_OR_DEPARTMENT_OTHER): Payer: Self-pay | Admitting: *Deleted

## 2011-05-31 DIAGNOSIS — J3489 Other specified disorders of nose and nasal sinuses: Secondary | ICD-10-CM | POA: Insufficient documentation

## 2011-05-31 DIAGNOSIS — J069 Acute upper respiratory infection, unspecified: Secondary | ICD-10-CM | POA: Insufficient documentation

## 2011-05-31 DIAGNOSIS — F17200 Nicotine dependence, unspecified, uncomplicated: Secondary | ICD-10-CM | POA: Insufficient documentation

## 2011-05-31 HISTORY — DX: Diverticulitis of intestine, part unspecified, without perforation or abscess without bleeding: K57.92

## 2011-05-31 MED ORDER — ACETAMINOPHEN-CODEINE 120-12 MG/5ML PO SUSP: 5.0000 mL | Freq: Three times a day (TID) | ORAL | Status: AC | PRN

## 2011-05-31 MED ORDER — PREDNISONE 20 MG PO TABS: ORAL_TABLET | ORAL | Status: AC

## 2011-05-31 MED ORDER — IBUPROFEN 800 MG PO TABS: 800.0000 mg | ORAL_TABLET | Freq: Three times a day (TID) | ORAL | Status: AC

## 2011-05-31 NOTE — ED Provider Notes (Signed)
History     CSN: [id number] Arrival date & time: 05/31/2011  6:17 AM   First MD Initiated Contact with Patient 05/31/11 [phone]      Chief Complaint  Patient presents with  . Nasal Congestion  . Chills    (Consider location/radiation/quality/duration/timing/severity/associated sxs/prior treatment) Patient is a 40 y.o. male presenting with cough. The history is provided by the patient.  Cough This is a new problem. The current episode started 2 days ago. The problem occurs constantly. The problem has not changed since onset.The cough is non-productive. Maximum temperature: Subjective fevers without measured temperature. The fever has been present for 1 to 2 days. Associated symptoms include chills, weight loss, rhinorrhea, sore throat and myalgias. Pertinent negatives include no chest pain, no ear pain, no headaches, no shortness of breath and no wheezing. Associated symptoms comments: Also severity of some diarrhea. Patient has a history of ulcerative colitis and currently cannot afford Asacol. So if he gets a flare he usually does a course of steroids which helps his symptoms of diarrhea. No blood or mucus in stools. No significant abdominal pains. He has tried decongestants for the symptoms. The treatment provided mild relief. Risk factors: Family member home with similar symptoms. Patient did get his flu shot this your. He is a smoker.   moderate in severity. Did have a hoarse voice but that is improving. No rashes or recent travel. Has not been evaluated for the same. Patient went to work today and his boss sent him here to be evaluated. He missed work yesterday because he was feeling sick.  Past Medical History  Diagnosis Date  . Ulcerative colitis   . Diverticulitis     Past Surgical History  Procedure Date  . Knee arthroscopy   . Hernia repair     History reviewed. No pertinent family history.  History  Substance Use Topics  . Smoking status: Current Everyday Smoker  .  Smokeless tobacco: Never Used  . Alcohol Use: No      Review of Systems  Constitutional: Positive for chills and weight loss. Negative for fever.  HENT: Positive for sore throat, rhinorrhea and voice change. Negative for ear pain, neck pain and neck stiffness.   Eyes: Negative for pain.  Respiratory: Positive for cough. Negative for shortness of breath and wheezing.   Cardiovascular: Negative for chest pain.  Gastrointestinal: Negative for abdominal pain.  Genitourinary: Negative for dysuria and flank pain.  Musculoskeletal: Positive for myalgias. Negative for back pain.  Skin: Negative for rash.  Neurological: Negative for headaches.  All other systems reviewed and are negative.    Allergies  Review of patient's allergies indicates no known allergies.  Home Medications  No current outpatient prescriptions on file.  BP 118/81  Pulse 80  Temp(Src) 98.2 F (36.8 C) (Oral)  Resp 18  Ht 5\' 8"  (1.727 m)  Wt 190 lb (86.183 kg)  BMI 28.89 kg/m2  SpO2 99%  Physical Exam  Constitutional: He is oriented to person, place, and time. He appears well-developed and well-nourished.  HENT:  Head: Normocephalic and atraumatic.  Nose: Nose normal.  Mouth/Throat: Oropharynx is clear and moist. No oropharyngeal exudate.       hoarse voice with nasal congestion  Eyes: Conjunctivae and EOM are normal. Pupils are equal, round, and reactive to light.  Neck: Trachea normal. Neck supple. No thyromegaly present.  Cardiovascular: Normal rate, regular rhythm, S1 normal, S2 normal and normal pulses.     No systolic murmur is present   No  diastolic murmur is present  Pulses:      Radial pulses are 2+ on the right side, and 2+ on the left side.  Pulmonary/Chest: Effort normal and breath sounds normal. He has no wheezes. He has no rhonchi. He has no rales. He exhibits no tenderness.  Abdominal: Soft. Normal appearance and bowel sounds are normal. There is no tenderness. There is no CVA tenderness  and negative Murphy's sign.  Musculoskeletal:       BLE:s Calves nontender, no cords or erythema, negative Homans sign  Neurological: He is alert and oriented to person, place, and time. He has normal strength. No cranial nerve deficit or sensory deficit. GCS eye subscore is 4. GCS verbal subscore is 5. GCS motor subscore is 6.  Skin: Skin is warm and dry. No rash noted. He is not diaphoretic.  Psychiatric: His speech is normal.       Cooperative and appropriate    ED Course  Procedures (including critical care time)     MDM  Clinical viral URI with cough, congestion, voice changes, and myalgias. Plan Motrin and Tylenol codeine and URI precautions. For diarrhea with history of ulcerative colitis, to taper prednisone prescribed. Patient is followed by [place] degreased) care followup. Work note provided.        Sunnie Nielsen, MD 05/31/11 [phone]

## 2011-05-31 NOTE — ED Notes (Signed)
C/o chills, congestion, sore throat and cough since Saturday

## 2011-05-31 NOTE — ED Notes (Signed)
MD at bedside. 

## 2011-09-13 ENCOUNTER — Emergency Department (HOSPITAL_BASED_OUTPATIENT_CLINIC_OR_DEPARTMENT_OTHER)
Admission: EM | Admit: 2011-09-13 | Discharge: 2011-09-13 | Disposition: A | Discharge status: Home / Self Care | Payer: Self-pay

## 2011-12-09 ENCOUNTER — Encounter (HOSPITAL_BASED_OUTPATIENT_CLINIC_OR_DEPARTMENT_OTHER): Payer: Self-pay | Admitting: *Deleted

## 2011-12-09 ENCOUNTER — Emergency Department (HOSPITAL_BASED_OUTPATIENT_CLINIC_OR_DEPARTMENT_OTHER)
Admission: EM | Admit: 2011-12-09 | Discharge: 2011-12-09 | Disposition: A | Discharge status: Home / Self Care | Payer: Self-pay | Attending: Emergency Medicine | Admitting: Emergency Medicine

## 2011-12-09 ENCOUNTER — Emergency Department (HOSPITAL_BASED_OUTPATIENT_CLINIC_OR_DEPARTMENT_OTHER): Payer: Self-pay

## 2011-12-09 DIAGNOSIS — M25559 Pain in unspecified hip: Secondary | ICD-10-CM | POA: Insufficient documentation

## 2011-12-09 DIAGNOSIS — F17200 Nicotine dependence, unspecified, uncomplicated: Secondary | ICD-10-CM | POA: Insufficient documentation

## 2011-12-09 DIAGNOSIS — K5190 Ulcerative colitis, unspecified, without complications: Secondary | ICD-10-CM | POA: Insufficient documentation

## 2011-12-09 DIAGNOSIS — M7632 Iliotibial band syndrome, left leg: Secondary | ICD-10-CM

## 2011-12-09 MED ORDER — TRAMADOL HCL 50 MG PO TABS: 100.0000 mg | ORAL_TABLET | Freq: Four times a day (QID) | ORAL | Status: DC | PRN

## 2011-12-09 MED ORDER — IBUPROFEN 200 MG PO TABS: 200.0000 mg | ORAL_TABLET | Freq: Four times a day (QID) | ORAL | Status: DC | PRN

## 2011-12-09 MED ORDER — MESALAMINE 400 MG PO TBEC: 800.0000 mg | DELAYED_RELEASE_TABLET | Freq: Three times a day (TID) | ORAL | Status: DC

## 2011-12-09 MED ORDER — TRAMADOL-ACETAMINOPHEN 37.5-325 MG PO TABS: 1.0000 | ORAL_TABLET | Freq: Four times a day (QID) | ORAL | Status: AC | PRN

## 2011-12-09 MED ORDER — HYDROCODONE-ACETAMINOPHEN 5-325 MG PO TABS
1.0000 | ORAL_TABLET | Freq: Once | ORAL | Status: AC
  Administered 2011-12-09: 1 via ORAL
  Filled 2011-12-09: qty 1

## 2011-12-09 NOTE — ED Notes (Signed)
Pain in his left hip after moving shingles 3 days ago. States pain is keeping him from sleeping.

## 2011-12-09 NOTE — Discharge Instructions (Signed)
Iliotibial Band Syndrome with Rehab The iliotibial (IT) band is a tendon that connects the hip muscles to the shinbone (tibia) and to one of the bones of the pelvis (ileum). The IT band passes by the knee and is often irritated by the outer portion of the knee (lateral femoral condyle). A fluid filled sac (bursa) exists between the tendon and the bone, to cushion and reduce friction. Overuse of the tendon may cause excessive friction, which results in IT band syndrome. This condition involves inflammation of the bursa (bursitis) and/or inflammation of the IT band (tendinitis). SYMPTOMS   Pain, tenderness, swelling, warmth, or redness over the IT band, at the outer knee (above the joint).   Pain that travels up or down the thigh or leg.   Initially, pain at the beginning of an exercise, that decreases once warmed up. Eventually, pain throughout the activity, getting worse as the activity continues. May cause the athlete to stop in the middle of training or competing.   Pain that gets worse when running down hills or stairs, on banked tracks, or next to the curb on the street.   Pain that increases when the foot of the affected leg hits the ground.   Possibly, a crackling sound (crepitation) when the tendon or bursa is moved or touched.  CAUSES  IT band syndrome is caused by irritation of the IT band and the underlying bursa. This eventually results in inflammation and pain. IT band syndrome is an overuse injury.  RISK INCREASES WITH:  Sports with repetitive knee-bending activities (distance running, cycling).   Incorrect training techniques, including sudden changes in the intensity, frequency, or duration of training.   Not enough rest between workouts.   Poor strength and flexibility, especially a tight IT band.   Failure to warm up properly before activity.   Bow legs.   Arthritis of the knee.  PREVENTION   Warm up and stretch properly before activity.   Allow for adequate  recovery between workouts.   Maintain physical fitness:   Strength, flexibility, and endurance.   Cardiovascular fitness.   Learn and use proper training technique, including reducing running mileage, shortening stride, and avoiding running on hills and banked surfaces.   Wear arch supports (orthotics), if you have flat feet.  PROGNOSIS  If treated properly, IT band syndrome usually goes away within 6 weeks of treatment. RELATED COMPLICATIONS   Longer healing time, if not properly treated, or if not given enough time to heal.   Recurring inflammation of the tendon and bursa, that may result in a chronic condition.   Recurring symptoms, if activity is resumed too soon, with overuse, with a direct blow, or with poor training technique.   Inability to complete training or competition.  TREATMENT  Treatment first involves the use of ice and medicine, to reduce pain and inflammation. The use of strengthening and stretching exercises may help reduce pain with activity. These exercises may be performed at home or with a therapist. For individuals with flat feet, an arch support (orthotic) may be helpful. Some individuals find that wearing a knee sleeve or compression bandage around the knee during workouts provides some relief. Certain training techniques, such as adjusting stride length, avoiding running on hills or stairs, changing the direction you run on a circular or banked track, or changing the side of the road you run on, if you run next to the curb, may help decrease symptoms of IT band syndrome. Cyclists may need to change the seat height  or foot position on their bicycles. An injection of cortisone into the bursa may be recommended. Surgery to remove the inflamed bursa and/or part of the IT band is only considered after at least 6 months of non-surgical treatment.  MEDICATION   If pain medicine is needed, nonsteroidal anti-inflammatory medicines (aspirin and ibuprofen), or other minor  pain relievers (acetaminophen), are often advised.   Do not take pain medicine for 7 days before surgery.   Prescription pain relievers may be given, if your caregiver thinks they are needed. Use only as directed and only as much as you need.   Corticosteroid injections may be given by your caregiver. These injections should be reserved for the most serious cases, because they may only be given a certain number of times.  HEAT AND COLD  Cold treatment (icing) should be applied for 10 to 15 minutes every 2 to 3 hours for inflammation and pain, and immediately after activity that aggravates your symptoms. Use ice packs or an ice massage.   Heat treatment may be used before performing stretching and strengthening activities prescribed by your caregiver, physical therapist, or athletic trainer. Use a heat pack or a warm water soak.  SEEK MEDICAL CARE IF:   Symptoms get worse or do not improve in 2 to 4 weeks, despite treatment.   New, unexplained symptoms develop. (Drugs used in treatment may produce side effects.)  EXERCISES  RANGE OF MOTION (ROM) AND STRETCHING EXERCISES - Iliotibial Band Syndrome These exercises may help you when beginning to rehabilitate your injury. Your symptoms may go away with or without further involvement from your physician, physical therapist or athletic trainer. While completing these exercises, remember:   Restoring tissue flexibility helps normal motion to return to the joints. This allows healthier, less painful movement and activity.   An effective stretch should be held for at least 30 seconds.   A stretch should never be painful. You should only feel a gentle lengthening or release in the stretched tissue.  STRETCH - Quadriceps, Prone   Lie on your stomach on a firm surface, such as a bed or padded floor.   Bend your right / left knee and grasp your ankle. If you are unable to reach your ankle or pant leg, use a belt around your foot to lengthen your  reach.   Gently pull your heel toward your buttocks. Your knee should not slide out to the side. You should feel a stretch in the front of your thigh and knee.   Hold this position for __________ seconds.  Repeat __________ times. Complete this stretch __________ times per day.  STRETCH - Iliotibial Band  On the floor or bed, lie on your side, so your right / left leg is on top. Bend your knee and grab your ankle.   Slowly bring your knee back so that your thigh is in line with your trunk. Keep your heel at your buttocks and gently arch your back, so your head, shoulders and hips line up.   Slowly lower your leg so that your knee approaches the floor or bed, until you feel a gentle stretch on the outside of your right / left thigh. If you do not feel a stretch and your knee will not fall farther, place the heel of your opposite foot on top of your knee, and pull your thigh down farther.   Hold this stretch for __________ seconds.  Repeat __________ times. Complete this stretch __________ times per day. STRENGTHENING EXERCISES - Iliotibial  Band Syndrome Improving the flexibility of the IT band will best relieve your discomfort due to IT band syndrome. Strengthening exercises, however, can help improve both muscle endurance and joint mechanics, reducing the factors that can contribute to this condition. Your physician, physical therapist or athletic trainer may provide you with exercises that train specific muscle groups that are especially weak. The following exercises target muscles that are often weak in people who have IT band syndrome. STRENGTH - Hip Abductors, Straight Leg Raises  Be aware of your form throughout the entire exercise, so that you exercise the correct muscles. Poor form means that you are not strengthening the correct muscles.  Lie on your side, so that your head, shoulders, knee and hip line up. You may bend your lower knee to help maintain your balance. Your right / left leg  should be on top.   Roll your hips slightly forward, so that your hips are stacked directly over each other and your right / left knee is facing forward.   Lift your top leg up 4-6 inches, leading with your heel. Be sure that your foot does not drift forward and that your knee does not roll toward the ceiling.   Hold this position for __________ seconds. You should feel the muscles in your outer hip lifting (you may not notice this until your leg begins to tire).   Slowly lower your leg to the starting position. Allow the muscles to fully relax before beginning the next repetition.  Repeat __________ times. Complete this exercise __________ times per day.  STRENGTH - Quad/VMO, Isometric  Sit in a chair with your right / left knee slightly bent. With your fingertips, feel the VMO muscle (just above the inside of your knee). The VMO is important in controlling the position of your kneecap.   Keeping your fingertips on this muscle. Without actually moving your leg, attempt to drive your knee down, as if straightening your leg. You should feel your VMO tense. If you have a difficult time, you may wish to try the same exercise on your healthy knee first.   Tense this muscle as hard as you can, without increasing any knee pain.   Hold for __________ seconds. Relax the muscles slowly and completely between each repetition.  Repeat __________ times. Complete this exercise __________ times per day.  Document Released: 07/04/2005 Document Revised: 06/23/2011 Document Reviewed: 10/16/2008 [facility] Patient Information 2012 [place], [place].

## 2011-12-09 NOTE — ED Provider Notes (Signed)
History     CSN: [id number]  Arrival date & time 12/09/11  1431   First MD Initiated Contact with Patient 12/09/11 1515      No chief complaint on file.   (Consider location/radiation/quality/duration/timing/severity/associated sxs/prior treatment) HPI The patient is a 41 yo man, history of UC, presenting with left hip pain.  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  He felt fine at the time, but later that night developed left-sided hip pain, described as a dull pain involving his left lower back, left hip, and left lateral upper leg.  The pain is worsened by sitting for long periods of time, better with activity.  The pain hadn't changed in 3 days, prompting him to seek medical attention.  No prior injury to his left knee or hip.  No fevers, chills, cp, SOB, abd pain, nausea, vomiting.  The patient has a history of UC, with his most recent flare 1 week ago, currently on day 7/10 of prednisone (20 mg/day).  Patient reports using a course of steroids only about once/year.  Past Medical History  Diagnosis Date  . Ulcerative colitis   . Diverticulitis     Past Surgical History  Procedure Date  . Knee arthroscopy   . Hernia repair     No family history on file.  History  Substance Use Topics  . Smoking status: Current Everyday Smoker  . Smokeless tobacco: Never Used  . Alcohol Use: No      Review of Systems ROS General: no fevers, chills, changes in weight, changes in appetite Skin: no rash HEENT: no blurry vision, hearing changes, sore throat Pulm: no dyspnea, coughing, wheezing CV: no chest pain, palpitations, shortness of breath Abd: no abdominal pain, nausea/vomiting, diarrhea/constipation GU: no dysuria, hematuria, polyuria Ext: see HPI Neuro: no weakness, numbness, or tingling  Allergies  Review of patient's allergies indicates no known  allergies.  Home Medications  No current outpatient prescriptions on file. Prednisone 20 mg daily (10-day course)  BP 124/84  Pulse 84  Temp 98.2 F (36.8 C)  Resp 20  SpO2 100%  Physical Exam General: alert, cooperative, and in no apparent distress HEENT: pupils equal round and reactive to light, vision grossly intact, oropharynx clear and non-erythematous  Neck: supple, no lymphadenopathy Lungs: clear to ascultation bilaterally, normal work of respiration, no wheezes, rales, ronchi Heart: regular rate and rhythm, no murmurs, gallops, or rubs Abdomen: soft, non-tender, non-distended, normal bowel sounds Msk: Mild tenderness to palpation of muscle groups of left lateral upper leg, no ttp of left hip joint, knee joint, or spine.  Mild tenderness with left hip abduction against resistance, otherwise full ROM without tenderness.  No warmth, erythema, or edema. Extremities: no cyanosis, clubbing, or edema Neurologic: alert & oriented X3, cranial nerves II-XII intact, strength grossly intact, sensation intact to light touch  ED Course  Procedures (including critical care time)  Labs Reviewed - No data to display No results found.   No diagnosis found.    MDM   # Left leg pain - the patient presents with a 3-day history of slow-onset left lateral leg pain, radiating to his hip.  Patient's physical exam was largely benign, but patient did have tenderness to palpation of muscles of left lateral leg, likely representing IT band syndrome.  Fracture is unlikely given description of pain, though patient does report steroid use once/year for UC. -x-ray left  hip, complete  # Dispo - discharge home with pain relievers and exercises for IT band syndrome  Addendum 5:00 pm - x-ray of left hip unremarkable.  Discharge home with ultracet, and instructions to ice area and perform stretches.    Linward Headland, MD 12/09/11 [phone]

## 2011-12-11 NOTE — ED Provider Notes (Signed)
I saw and evaluated the patient, reviewed the resident's note and I agree with the findings and plan.   .Face to face Exam:  General:  Awake HEENT:  Atraumatic Resp:  Normal effort Abd:  Nondistended Neuro:No focal weakness Lymph: No adenopathy   Nelia Shi, MD 12/11/11 1026

## 2012-03-14 ENCOUNTER — Emergency Department (HOSPITAL_COMMUNITY)
Admission: EM | Admit: 2012-03-14 | Discharge: 2012-03-14 | Disposition: A | Discharge status: Home / Self Care | Payer: Self-pay | Attending: Emergency Medicine | Admitting: Emergency Medicine

## 2012-03-14 ENCOUNTER — Encounter (HOSPITAL_COMMUNITY): Payer: Self-pay | Admitting: Emergency Medicine

## 2012-03-14 ENCOUNTER — Emergency Department (HOSPITAL_COMMUNITY): Payer: Self-pay

## 2012-03-14 DIAGNOSIS — F17200 Nicotine dependence, unspecified, uncomplicated: Secondary | ICD-10-CM | POA: Insufficient documentation

## 2012-03-14 DIAGNOSIS — S4980XA Other specified injuries of shoulder and upper arm, unspecified arm, initial encounter: Secondary | ICD-10-CM | POA: Insufficient documentation

## 2012-03-14 DIAGNOSIS — M25519 Pain in unspecified shoulder: Secondary | ICD-10-CM

## 2012-03-14 DIAGNOSIS — S46909A Unspecified injury of unspecified muscle, fascia and tendon at shoulder and upper arm level, unspecified arm, initial encounter: Secondary | ICD-10-CM | POA: Insufficient documentation

## 2012-03-14 DIAGNOSIS — M19019 Primary osteoarthritis, unspecified shoulder: Secondary | ICD-10-CM

## 2012-03-14 DIAGNOSIS — K5190 Ulcerative colitis, unspecified, without complications: Secondary | ICD-10-CM | POA: Insufficient documentation

## 2012-03-14 DIAGNOSIS — X500XXA Overexertion from strenuous movement or load, initial encounter: Secondary | ICD-10-CM | POA: Insufficient documentation

## 2012-03-14 DIAGNOSIS — Y9289 Other specified places as the place of occurrence of the external cause: Secondary | ICD-10-CM | POA: Insufficient documentation

## 2012-03-14 DIAGNOSIS — Y990 Civilian activity done for income or pay: Secondary | ICD-10-CM | POA: Insufficient documentation

## 2012-03-14 MED ORDER — IBUPROFEN 800 MG PO TABS
800.0000 mg | ORAL_TABLET | Freq: Once | ORAL | Status: AC
  Administered 2012-03-14: 800 mg via ORAL
  Filled 2012-03-14: qty 1

## 2012-03-14 MED ORDER — IBUPROFEN 800 MG PO TABS: 800.0000 mg | ORAL_TABLET | Freq: Three times a day (TID) | ORAL | Status: AC

## 2012-03-14 MED ORDER — TRAMADOL HCL 50 MG PO TABS: 50.0000 mg | ORAL_TABLET | Freq: Four times a day (QID) | ORAL | Status: AC | PRN

## 2012-03-14 NOTE — ED Provider Notes (Signed)
History     CSN: [id number]  Arrival date & time 03/14/12  0507   First MD Initiated Contact with Patient 03/14/12 0541      Chief Complaint  Patient presents with  . Shoulder Injury    (Consider location/radiation/quality/duration/timing/severity/associated sxs/prior treatment) HPI HX per PT. R shoulder pain, worse after heavy lifting yesterday. Went to lift a tool box while at work with onset of pain. No swelling. No redness or skin break. Worse with movement, denies h/o same. No fall or weakness/ numbness. Mod in severity. No known alleviating factors.  Past Medical History  Diagnosis Date  . Ulcerative colitis   . Diverticulitis     Past Surgical History  Procedure Date  . Knee arthroscopy   . Hernia repair     No family history on file.  History  Substance Use Topics  . Smoking status: Current Everyday Smoker  . Smokeless tobacco: Never Used  . Alcohol Use: No      Review of Systems  Constitutional: Negative for fever and chills.  HENT: Negative for neck pain and neck stiffness.   Eyes: Negative for pain.  Respiratory: Negative for shortness of breath.   Cardiovascular: Negative for chest pain.  Gastrointestinal: Negative for abdominal pain.  Genitourinary: Negative for dysuria.  Musculoskeletal: Negative for back pain.  Skin: Negative for rash.  Neurological: Negative for weakness and numbness.  All other systems reviewed and are negative.    Allergies  Review of patient's allergies indicates no known allergies.  Home Medications   Current Outpatient Rx  Name Route Sig Dispense Refill  . IBUPROFEN 200 MG PO TABS Oral Take 200-400 mg by mouth every 6 (six) hours as needed. Headache or pain      BP 129/84  Pulse 65  Temp 98 F (36.7 C) (Oral)  Resp 14  SpO2 98%  Physical Exam  Constitutional: He is oriented to person, place, and time. He appears well-developed and well-nourished.  HENT:  Head: Normocephalic and atraumatic.  Eyes:  Conjunctivae and EOM are normal. Pupils are equal, round, and reactive to light.  Neck: Trachea normal. Neck supple. No thyromegaly present.  Cardiovascular: Normal rate, regular rhythm, S1 normal, S2 normal and normal pulses.     No systolic murmur is present   No diastolic murmur is present  Pulses:      Radial pulses are 2+ on the right side, and 2+ on the left side.  Pulmonary/Chest: Effort normal and breath sounds normal. He has no wheezes. He has no rhonchi. He has no rales. He exhibits no tenderness.  Abdominal: Soft. Normal appearance and bowel sounds are normal. There is no tenderness. There is no CVA tenderness and negative Murphy's sign.  Musculoskeletal:       RUE: TTP over [facility] joint without obvious deformity, skin intact without erythema or inc warmth to touch. Pain with ROM. Distal N/V intact, no tenderness over elbow or clavicle.   Neurological: He is alert and oriented to person, place, and time. He has normal strength. No cranial nerve deficit or sensory deficit. GCS eye subscore is 4. GCS verbal subscore is 5. GCS motor subscore is 6.  Skin: Skin is warm and dry. No rash noted. He is not diaphoretic.  Psychiatric: His speech is normal.       Cooperative and appropriate    ED Course  Procedures (including critical care time)  Labs Reviewed - No data to display Dg Shoulder Right  03/14/2012  *RADIOLOGY REPORT*  Clinical Data: Right  shoulder pain.  RIGHT SHOULDER - 2+ VIEW  Comparison: 10/07/2006  Findings: Degenerative changes of the glenohumeral and acromioclavicular joints have increased in the interval.  No fracture or dislocation.  No aggressive osseous lesion.  Right upper lung clear.  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  No acute osseous abnormality.   Original Report Authenticated By: Waneta Martins, M.D.    Gustavus Bryant. Motrin and sling provided.   Plan Ortho referral and follow up for R AC pain/ tenderness/ arthritis. Sling as  needed for comfort. RX motrin and Ultram.    MDM   Nursing notes and VS reviewed. Imaging reviewed as above. Pain medication provided.         Sunnie Nielsen, MD 03/14/12 [phone]

## 2012-03-14 NOTE — ED Notes (Signed)
PT. REPORTS RIGHT SHOULDER PAIN ONSET YESTERDAY AFTER HEAVY LIFTING , PAIN WORSE WITH MOVEMENT.

## 2012-03-14 NOTE — ED Notes (Signed)
Pt states at 1700 hrs  8.27.13 while doing heavy lifting he thinks he pu lled a muscle in his shoulder.  No obvious deformity, denies numbness and tingling.  States pain 7/10 not relieved by aleve.  Unable to raise or abduct RUE.  Palpable radial pulse.  CNS intact

## 2012-09-24 ENCOUNTER — Emergency Department (HOSPITAL_BASED_OUTPATIENT_CLINIC_OR_DEPARTMENT_OTHER): Payer: Self-pay

## 2012-09-24 ENCOUNTER — Encounter (HOSPITAL_BASED_OUTPATIENT_CLINIC_OR_DEPARTMENT_OTHER): Payer: Self-pay

## 2012-09-24 ENCOUNTER — Emergency Department (HOSPITAL_BASED_OUTPATIENT_CLINIC_OR_DEPARTMENT_OTHER)
Admission: EM | Admit: 2012-09-24 | Discharge: 2012-09-24 | Disposition: A | Discharge status: Home / Self Care | Payer: Self-pay | Attending: Emergency Medicine | Admitting: Emergency Medicine

## 2012-09-24 DIAGNOSIS — IMO0002 Reserved for concepts with insufficient information to code with codable children: Secondary | ICD-10-CM | POA: Insufficient documentation

## 2012-09-24 DIAGNOSIS — Y939 Activity, unspecified: Secondary | ICD-10-CM | POA: Insufficient documentation

## 2012-09-24 DIAGNOSIS — S060X1A Concussion with loss of consciousness of 30 minutes or less, initial encounter: Secondary | ICD-10-CM | POA: Insufficient documentation

## 2012-09-24 DIAGNOSIS — Z8719 Personal history of other diseases of the digestive system: Secondary | ICD-10-CM | POA: Insufficient documentation

## 2012-09-24 DIAGNOSIS — F17200 Nicotine dependence, unspecified, uncomplicated: Secondary | ICD-10-CM | POA: Insufficient documentation

## 2012-09-24 DIAGNOSIS — S139XXA Sprain of joints and ligaments of unspecified parts of neck, initial encounter: Secondary | ICD-10-CM | POA: Insufficient documentation

## 2012-09-24 DIAGNOSIS — Y92410 Unspecified street and highway as the place of occurrence of the external cause: Secondary | ICD-10-CM | POA: Insufficient documentation

## 2012-09-24 MED ORDER — FENTANYL CITRATE 0.05 MG/ML IJ SOLN
100.0000 ug | Freq: Once | INTRAMUSCULAR | Status: AC
  Administered 2012-09-24: 100 ug via NASAL

## 2012-09-24 MED ORDER — OXYCODONE-ACETAMINOPHEN 5-325 MG PO TABS
2.0000 | ORAL_TABLET | Freq: Once | ORAL | Status: AC
  Administered 2012-09-24: 2 via ORAL
  Filled 2012-09-24 (×2): qty 2

## 2012-09-24 MED ORDER — FENTANYL CITRATE 0.05 MG/ML IJ SOLN
INTRAMUSCULAR | Status: AC
  Filled 2012-09-24: qty 2

## 2012-09-24 MED ORDER — OXYCODONE-ACETAMINOPHEN 5-325 MG PO TABS
2.0000 | ORAL_TABLET | Freq: Four times a day (QID) | ORAL | Status: DC | PRN
Start: 2012-09-24 — End: 2012-10-15
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
## 2012-09-24 NOTE — ED Notes (Signed)
Denies difficulty with ambulation. Dr Fonnie Jarvis at bedside . VO placed for xrays
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
## 2012-09-25 ENCOUNTER — Encounter (HOSPITAL_COMMUNITY): Payer: Self-pay | Admitting: Emergency Medicine

## 2012-09-25 ENCOUNTER — Emergency Department (HOSPITAL_COMMUNITY)
Admission: EM | Admit: 2012-09-25 | Discharge: 2012-09-25 | Disposition: A | Discharge status: Home / Self Care | Payer: Self-pay | Attending: Emergency Medicine | Admitting: Emergency Medicine

## 2012-09-25 DIAGNOSIS — F17200 Nicotine dependence, unspecified, uncomplicated: Secondary | ICD-10-CM | POA: Insufficient documentation

## 2012-09-25 DIAGNOSIS — R109 Unspecified abdominal pain: Secondary | ICD-10-CM | POA: Insufficient documentation

## 2012-09-25 DIAGNOSIS — R110 Nausea: Secondary | ICD-10-CM | POA: Insufficient documentation

## 2012-09-25 DIAGNOSIS — R51 Headache: Secondary | ICD-10-CM | POA: Insufficient documentation

## 2012-09-25 DIAGNOSIS — R197 Diarrhea, unspecified: Secondary | ICD-10-CM | POA: Insufficient documentation

## 2012-09-25 DIAGNOSIS — Z8719 Personal history of other diseases of the digestive system: Secondary | ICD-10-CM | POA: Insufficient documentation

## 2012-09-25 LAB — COMPREHENSIVE METABOLIC PANEL
Alkaline Phosphatase: 70 U/L (ref 39–117)
BUN: 13 mg/dL (ref 6–23)
Chloride: 106 mEq/L (ref 96–112)
GFR calc Af Amer: >90 mL/min (ref >90)
Glucose, Bld: 96 mg/dL (ref 70–99)
Potassium: 4.1 mEq/L (ref 3.5–5.1)
Total Bilirubin: 0.5 mg/dL (ref 0.3–1.2)

## 2012-09-25 LAB — CBC WITH DIFFERENTIAL/PLATELET
Hemoglobin: 13.8 g/dL (ref 13.0–17.0)
Lymphocytes Relative: 30 % (ref 12–46)
Lymphs Abs: 1.9 10*3/uL (ref 0.7–4.0)
Monocytes Relative: 8 % (ref 3–12)
Neutro Abs: 3.2 10*3/uL (ref 1.7–7.7)
Neutrophils Relative %: 53 % (ref 43–77)
RBC: 4.7 MIL/uL (ref 4.22–5.81)

## 2012-09-25 MED ORDER — DIPHENOXYLATE-ATROPINE 2.5-0.025 MG PO TABS: 1.0000 | ORAL_TABLET | Freq: Four times a day (QID) | ORAL | Status: DC | PRN

## 2012-09-25 MED ORDER — SODIUM CHLORIDE 0.9 % IV BOLUS (SEPSIS)
1000.0000 mL | Freq: Once | INTRAVENOUS | Status: AC
  Administered 2012-09-25: 1000 mL via INTRAVENOUS

## 2012-09-25 MED ORDER — PREDNISONE 20 MG PO TABS: ORAL_TABLET | ORAL | Status: DC

## 2012-09-25 MED ORDER — PREDNISONE 20 MG PO TABS
60.0000 mg | ORAL_TABLET | Freq: Once | ORAL | Status: AC
  Administered 2012-09-25: 60 mg via ORAL
  Filled 2012-09-25: qty 3
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
## 2012-09-25 NOTE — ED Provider Notes (Signed)
Medical screening examination/treatment/procedure(s) were performed by non-physician practitioner and as supervising physician I was immediately available for consultation/collaboration.   Ronica Vivian, MD 09/25/12 1536 

## 2012-09-25 NOTE — ED Notes (Signed)
Per patient, states diarrhea for a couple of days-bloody stool this am-"no a lot" per patient-has a history of colitis/diverticulosis

## 2012-10-13 ENCOUNTER — Emergency Department (HOSPITAL_COMMUNITY): Payer: Self-pay

## 2012-10-13 ENCOUNTER — Observation Stay (HOSPITAL_COMMUNITY)
Admission: EM | Admit: 2012-10-13 | Discharge: 2012-10-15 | Disposition: A | Discharge status: Home / Self Care | Payer: MEDICAID | Attending: Internal Medicine | Admitting: Internal Medicine

## 2012-10-13 ENCOUNTER — Encounter (HOSPITAL_COMMUNITY): Payer: Self-pay | Admitting: Emergency Medicine

## 2012-10-13 DIAGNOSIS — K4090 Unilateral inguinal hernia, without obstruction or gangrene, not specified as recurrent: Secondary | ICD-10-CM | POA: Insufficient documentation

## 2012-10-13 DIAGNOSIS — N400 Enlarged prostate without lower urinary tract symptoms: Secondary | ICD-10-CM | POA: Insufficient documentation

## 2012-10-13 DIAGNOSIS — Z91199 Patient's noncompliance with other medical treatment and regimen due to unspecified reason: Secondary | ICD-10-CM

## 2012-10-13 DIAGNOSIS — K922 Gastrointestinal hemorrhage, unspecified: Secondary | ICD-10-CM

## 2012-10-13 DIAGNOSIS — K649 Unspecified hemorrhoids: Secondary | ICD-10-CM | POA: Diagnosis present

## 2012-10-13 DIAGNOSIS — K625 Hemorrhage of anus and rectum: Secondary | ICD-10-CM | POA: Insufficient documentation

## 2012-10-13 DIAGNOSIS — K5190 Ulcerative colitis, unspecified, without complications: Secondary | ICD-10-CM

## 2012-10-13 DIAGNOSIS — K5180 Other ulcerative colitis without complications: Principal | ICD-10-CM | POA: Insufficient documentation

## 2012-10-13 DIAGNOSIS — Z79899 Other long term (current) drug therapy: Secondary | ICD-10-CM | POA: Insufficient documentation

## 2012-10-13 DIAGNOSIS — Z9119 Patient's noncompliance with other medical treatment and regimen: Secondary | ICD-10-CM | POA: Insufficient documentation

## 2012-10-13 DIAGNOSIS — R198 Other specified symptoms and signs involving the digestive system and abdomen: Secondary | ICD-10-CM | POA: Insufficient documentation

## 2012-10-13 DIAGNOSIS — K7689 Other specified diseases of liver: Secondary | ICD-10-CM | POA: Insufficient documentation

## 2012-10-13 DIAGNOSIS — K594 Anal spasm: Secondary | ICD-10-CM | POA: Insufficient documentation

## 2012-10-13 DIAGNOSIS — K51911 Ulcerative colitis, unspecified with rectal bleeding: Secondary | ICD-10-CM | POA: Diagnosis present

## 2012-10-13 LAB — BASIC METABOLIC PANEL
BUN: 18 mg/dL (ref 6–23)
Chloride: 100 mEq/L (ref 96–112)
GFR calc Af Amer: >90 mL/min (ref >90)
Potassium: 3.5 mEq/L (ref 3.5–5.1)
Sodium: 136 mEq/L (ref 135–145)

## 2012-10-13 LAB — CBC WITH DIFFERENTIAL/PLATELET
Basophils Absolute: 0.1 10*3/uL (ref 0.0–0.1)
Basophils Relative: 1 % (ref 0–1)
Hemoglobin: 15.7 g/dL (ref 13.0–17.0)
MCHC: 34.7 g/dL (ref 30.0–36.0)
Monocytes Relative: 11 % (ref 3–12)
Neutro Abs: 4.5 10*3/uL (ref 1.7–7.7)
Neutrophils Relative %: 59 % (ref 43–77)
Platelets: 286 10*3/uL (ref 150–400)

## 2012-10-13 LAB — SEDIMENTATION RATE: Sed Rate: 1 mm/hr (ref 0–16)

## 2012-10-13 LAB — HEPATIC FUNCTION PANEL
ALT: 13 U/L (ref 0–53)
AST: 15 U/L (ref 0–37)
Albumin: 3 g/dL — ABNORMAL LOW (ref 3.5–5.2)
Total Protein: 6.5 g/dL (ref 6.0–8.3)

## 2012-10-13 MED ORDER — MORPHINE SULFATE 4 MG/ML IJ SOLN: 4.0000 mg | INTRAMUSCULAR | Status: DC | PRN

## 2012-10-13 MED ORDER — POTASSIUM CHLORIDE IN NACL 20-0.9 MEQ/L-% IV SOLN
INTRAVENOUS | Status: DC
  Administered 2012-10-13 – 2012-10-14 (×2): via INTRAVENOUS
  Filled 2012-10-13 (×2): qty 1000

## 2012-10-13 MED ORDER — IOHEXOL 300 MG/ML  SOLN
50.0000 mL | Freq: Once | INTRAMUSCULAR | Status: AC | PRN
  Administered 2012-10-13: 50 mL via ORAL

## 2012-10-13 MED ORDER — ONDANSETRON HCL 4 MG/2ML IJ SOLN
4.0000 mg | Freq: Once | INTRAMUSCULAR | Status: AC
  Administered 2012-10-13: 4 mg via INTRAVENOUS
  Filled 2012-10-13: qty 2

## 2012-10-13 MED ORDER — MESALAMINE 1000 MG RE SUPP
1000.0000 mg | Freq: Every day | RECTAL | Status: DC
  Administered 2012-10-13 – 2012-10-14 (×2): 1000 mg via RECTAL
  Filled 2012-10-13 (×3): qty 1

## 2012-10-13 MED ORDER — IOHEXOL 300 MG/ML  SOLN
100.0000 mL | Freq: Once | INTRAMUSCULAR | Status: AC | PRN
  Administered 2012-10-13: 100 mL via INTRAVENOUS

## 2012-10-13 MED ORDER — ONDANSETRON HCL 4 MG PO TABS: 4.0000 mg | ORAL_TABLET | Freq: Four times a day (QID) | ORAL | Status: DC | PRN

## 2012-10-13 MED ORDER — INFLUENZA VIRUS VACC SPLIT PF IM SUSP
0.5000 mL | INTRAMUSCULAR | Status: AC
  Administered 2012-10-14: 0.5 mL via INTRAMUSCULAR
  Filled 2012-10-13 (×2): qty 0.5

## 2012-10-13 MED ORDER — OXYCODONE HCL 5 MG PO TABS
5.0000 mg | ORAL_TABLET | ORAL | Status: DC | PRN
  Administered 2012-10-13 – 2012-10-15 (×4): 10 mg via ORAL
  Filled 2012-10-13 (×4): qty 2

## 2012-10-13 MED ORDER — SODIUM CHLORIDE 0.9 % IV SOLN
Freq: Once | INTRAVENOUS | Status: AC
  Administered 2012-10-13: 11:00:00 via INTRAVENOUS

## 2012-10-13 MED ORDER — ACETAMINOPHEN 650 MG RE SUPP: 650.0000 mg | Freq: Four times a day (QID) | RECTAL | Status: DC | PRN

## 2012-10-13 MED ORDER — ACETAMINOPHEN 325 MG PO TABS: 650.0000 mg | ORAL_TABLET | Freq: Four times a day (QID) | ORAL | Status: DC | PRN

## 2012-10-13 MED ORDER — MESALAMINE ER 250 MG PO CPCR
1000.0000 mg | ORAL_CAPSULE | Freq: Four times a day (QID) | ORAL | Status: DC
  Administered 2012-10-13 – 2012-10-15 (×7): 1000 mg via ORAL
  Filled 2012-10-13 (×11): qty 4

## 2012-10-13 MED ORDER — ONDANSETRON HCL 4 MG/2ML IJ SOLN: 4.0000 mg | Freq: Four times a day (QID) | INTRAMUSCULAR | Status: DC | PRN

## 2012-10-13 MED ORDER — MORPHINE SULFATE 4 MG/ML IJ SOLN
4.0000 mg | Freq: Once | INTRAMUSCULAR | Status: AC
  Administered 2012-10-13: 4 mg via INTRAVENOUS
  Filled 2012-10-13: qty 1

## 2012-10-13 MED ORDER — ALUM & MAG HYDROXIDE-SIMETH 200-200-20 MG/5ML PO SUSP: 30.0000 mL | Freq: Four times a day (QID) | ORAL | Status: DC | PRN
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
## 2012-10-13 NOTE — ED Notes (Signed)
Pt complains of diarrhea and bloody stool x 2 days. Pt reports to just finishing Prednisone for Ulcerative colitis.

## 2012-10-13 NOTE — H&P (Signed)
[facility] History and Physical  Connor Mcpherson AVW:[id number] DOB: [birth year] DOA: 10/13/2012  Referring physician: Dr. Richrd Prime PCP: Default, Provider, MD   Chief Complaint: Bloody diarrhea   History of Present Illness: Connor Mcpherson is an 42 y.o. male with a past medical history of ulcerative colitis, not under the care of a primary care doctor or gastroenterologist, who presented to the hospital originally on 09/25/2012 with a flare of his ulcerative colitis. At that time, his symptoms included mild cramping but no fever, nausea, or vomiting. He was discharged on a course of prednisone, and completed the treatment course 10/12/12. The patient's symptoms returned approximately 2 days ago and today, he has had 5 episodes of diarrhea that was frankly bloody. He also has intermittent, diffuse, crampy abdominal pain which is rated a 7/10. There are no specific aggravating or alleviating factors.  A CT scan was obtained in the emergency department which confirmed mild colitis.  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 Jarold Motto, dated 12/01/10).  Apparently was discharged from Dr. Norval Gable care secondary to non-compliance.  He spent 3 years in prison, during which time his symptoms were well controlled on low dose prednisone and 5-ASA.  Review of Systems: Constitutional: No fever, + chills;  Appetite normal; No weight loss, no weight gain.  HEENT: No blurry vision, no diplopia, no pharyngitis, no dysphagia, +wears reading glasses CV: No chest pain, no palpitations.  Resp: No SOB, no cough. GI: No nausea, no vomiting, + diarrhea, no melena, + hematochezia.  GU: No dysuria, no hematuria.  MSK: no myalgias, no arthralgias.  Neuro:  No  headache, no focal neurological deficits, no history of seizures.  Psych: No depression, no anxiety.  Endo: No thyroid disease, no DM, no heat intolerance, no cold intolerance, no polyuria, no polydipsia  Skin: No rashes, no skin lesions.  Heme: No easy bruising, no history of blood diseases.  Past Medical History Past Medical History  Diagnosis Date  . Ulcerative colitis   . Diverticulitis      Past Surgical History Past Surgical History  Procedure Laterality Date  . Knee arthroscopy    . Hernia repair       Social History: History   Social History  . Marital Status: Single    Spouse Name: N/A    Number of Children: 1  . Years of Education: N/A   Occupational History  . Unemployed, worked in Pharmacist, hospital in past    Social History Main Topics  . Smoking status: Current Every Day Smoker -- 0.50 packs/day for 12.5 years    Types: Cigarettes  . Smokeless tobacco: Never Used  . Alcohol Use: No     Comment: No ETOH in 2 weeks; Prior 2-3 beers every 2-3 weeks.  . Drug Use: No  . Sexually Active: Not on file   Other Topics Concern  . Not on file   Social History Narrative   Single.  Lives in a boardinghouse.  Has been in prison.      Family History:  Family History  Problem Relation Age of Onset  . Cancer Neg Hx   . Diabetes Neg Hx   . Heart failure Neg Hx   . Ulcerative colitis Neg Hx   .  Crohn's disease Neg Hx     Allergies: Review of patient's allergies indicates no known allergies.  Meds: Prior to Admission medications   Medication Sig Start Date End Date Taking? Authorizing Provider  oxyCODONE-acetaminophen (PERCOCET) 5-325 MG per tablet Take 2 tablets by mouth every 6 (six) hours as needed for pain. 09/24/12  Yes Hurman Horn, MD  predniSONE (DELTASONE) 20 MG tablet 3 Tabs PO Days 1-3, then 2 tabs PO Days 4-6, then 1 tab PO Day 7-9, then Half Tab PO Day 10-12 09/25/12  Yes Renne Crigler, PA-C    Physical Exam: Filed Vitals:   10/13/12 1007 10/13/12 1323   BP: 133/79 124/66  Pulse: 74 63  Temp: 97.7 F (36.5 C) 98.5 F (36.9 C)  TempSrc: Oral Oral  SpO2: 100% 100%     Physical Exam: Blood pressure 124/66, pulse 63, temperature 98.5 F (36.9 C), temperature source Oral, SpO2 100.00%. Gen: No acute distress. Head: Normocephalic, atraumatic. Eyes: PERRL, EOMI, sclerae nonicteric. Mouth: Oropharynx clear with no exudates. Neck: Supple, no thyromegaly, no lymphadenopathy, no jugular venous distention. Chest: Lungs CTAB. CV: Heart sounds regular, no murmurs, rubs or gallops. Abdomen: Soft, nontender, nondistended with normal active bowel sounds. Extremities: Extremities without clubbing, edema or cyanosis. Skin: Warm and dry. Neuro: Alert and oriented times 3; cranial nerves II through XII grossly intact. Psych: Mood and affect normal.  Labs on Admission:  Basic Metabolic Panel:  Recent Labs Lab 10/13/12 1030  NA 136  K 3.5  CL 100  CO2 30  GLUCOSE 106*  BUN 18  CREATININE 1.09  CALCIUM 8.7   CBC:  Recent Labs Lab 10/13/12 1030  WBC 7.6  NEUTROABS 4.5  HGB 15.7  HCT 45.3  MCV 89.5  PLT 286   Radiological Exams on Admission: Ct Abdomen Pelvis W Contrast  10/13/2012  *RADIOLOGY REPORT*  Clinical Data: Abdominal pain.  History of colitis and diverticulitis with rectal bleeding.  CT ABDOMEN AND PELVIS WITH CONTRAST  Technique:  Multidetector CT imaging of the abdomen and pelvis was performed following the standard protocol during bolus administration of intravenous contrast.  Contrast: OMNIPAQUE IOHEXOL 300 MG/ML  SOLN  Comparison: 06/24/2010.  Findings: Mild diffuse low density of the liver relative to the spleen.  Mild diffuse low density wall thickening involving the distal transverse colon, splenic flexure, descending colon, sigmoid colon and rectum.  Minimal associated pericolonic soft tissue stranding.  No fluid collections or enlarged lymph nodes.  Moderately enlarged prostate gland.  Small right inguinal  hernia containing fat.  Normal appearing spleen, pancreas, gallbladder, adrenal glands, kidneys and urinary bladder.  The bilateral sacroiliac joint degenerative changes.  Mild lumbar and lower thoracic spine degenerative changes.  IMPRESSION:  1.  Mild colitis involving the distal transverse colon, splenic flexure, descending colon, sigmoid colon and rectum, compatible with ulcerative colitis. 2.  Mild diffuse hepatic steatosis.   Original Report Authenticated By: Beckie Salts, M.D.    Assessment/Plan Principal Problem:   Ulcerative colitis with rectal bleeding -Will admit and place on oral and rectal mesalamine. -Gently hydrate. -No evidence of significant rectal bleeding given hemoglobin of 15.7.  Recheck CBC in a.m. -Check CRP, ESR.  Hold off on stool studies (no leukocytosis or fever). Active Problems:   Non compliance with medical treatment -Given his dismissal from Dr. Norval Gable service, and limited access to outpatient care, appropriate follow up will be challenging.   -Case manager consultation to assist with obtaining outpatient follow up.   Code Status: Full. Family Communication: Connor Mcpherson  Estanislado Spire [phone] (mother) Disposition Plan: Home when stable.  Time spent: 1 hour.  Connor Mcpherson [facility] Pager [phone]  If 7PM-7AM, please contact night-coverage [email or web address] Password [facility] 10/13/2012, 2:51 PM

## 2012-10-13 NOTE — ED Notes (Signed)
Patient transported to CT 

## 2012-10-13 NOTE — ED Notes (Signed)
MD at bedside. 

## 2012-10-13 NOTE — ED Notes (Signed)
complaining of stomach pain for about 3 days. Patient came in today because pain unbearable. Hx of colitis.

## 2012-10-14 DIAGNOSIS — K649 Unspecified hemorrhoids: Secondary | ICD-10-CM | POA: Diagnosis present

## 2012-10-14 LAB — BASIC METABOLIC PANEL
BUN: 14 mg/dL (ref 6–23)
CO2: 28 mEq/L (ref 19–32)
Chloride: 103 mEq/L (ref 96–112)
Creatinine, Ser: 1.16 mg/dL (ref 0.50–1.35)
Potassium: 4.1 mEq/L (ref 3.5–5.1)

## 2012-10-14 LAB — CBC
HCT: 41.5 % (ref 39.0–52.0)
Hemoglobin: 14 g/dL (ref 13.0–17.0)
MCV: 89.2 fL (ref 78.0–100.0)
RBC: 4.65 MIL/uL (ref 4.22–5.81)
WBC: 6.7 10*3/uL (ref 4.0–10.5)

## 2012-10-14 MED ORDER — BARRIER CREAM NON-SPECIFIED
1.0000 "application " | TOPICAL_CREAM | TOPICAL | Status: DC | PRN
  Filled 2012-10-14: qty 1

## 2012-10-14 MED ORDER — HYOSCYAMINE SULFATE 0.125 MG SL SUBL
0.2500 mg | SUBLINGUAL_TABLET | SUBLINGUAL | Status: DC | PRN
  Administered 2012-10-15: 0.25 mg via SUBLINGUAL
  Filled 2012-10-14 (×2): qty 2

## 2012-10-14 MED ORDER — PRAMOXINE HCL 1 % RE FOAM
RECTAL | Status: DC | PRN
  Administered 2012-10-14: 14:00:00 via RECTAL
  Filled 2012-10-14: qty 15

## 2012-10-14 NOTE — Care Management Note (Signed)
    Page 1 of 2   10/14/2012     12:51:10 PM   CARE MANAGEMENT NOTE 10/14/2012  Patient:  Connor Mcpherson, Connor Mcpherson   Account Number:  [id number]  Date Initiated:  10/14/2012  Documentation initiated by:  [facility]  Subjective/Objective Assessment:   ADMITTED W/BLOODY DIARRHEA.ULCERATIVE COLITIS.HY:QMVH     Action/Plan:   LIVES @ [facility] [place].   Anticipated DC Date:  10/14/2012   Anticipated DC Plan:  HOME/SELF CARE  In-house referral  PCP / [facility]      DC Planning Services  CM consult  MATCH Program      Choice offered to / List presented to:             Status of service:  Completed, signed off Medicare Important Message given?   (If response is "NO", the following Medicare IM given date fields will be blank) Date Medicare IM given:   Date Additional Medicare IM given:    Discharge Disposition:  HOME/SELF CARE  Per UR Regulation:    If discussed at Long Length of Stay Meetings, dates discussed:    Comments:  10/14/12 [facility] RN,BSN NCM [phone] PROVIDED PATIENT W/GI DOCTOR OFFICE DR[facility],NOTED ON D/C FOLLOW UP.PATIENT ENCOURAGED TO CALL TO MAKE APPT.MATCH PROGRAM FOR MED ASST APPROVED.INFORMED PATIENT OF POLICY:1 TIME SERVICE FOR NEXT 12MONTHS/34 DAY SCRIPT-PER PHYSICIAN DISCRETION/NO REFILLS/MUST FILL WITHIN 7DAYS OF D/C/$3 CO-PAY FOR EACH SCRIPT-PATIENT STATES HE CAN AFFORD/MUST TAKE FORM ALONG W/SCRIPT TO SELECTED PHARMACIES PROVIDED/FORM GIVEN TO PATIENT.TC [facility] ON [address] [place]-THEY ARE OPEN 24HR,& THEY WERE ABLE TO PULL UP PATIENT FOR THE APPROPVAL OF MATCH PROGRAM.& THEY HAVE THE MESALAMINE 0.5MG  TABLET.HIGHLIGHTED ADDRESS ON FORM FOR PATIENT.OTHER COMMUNITY RESOURCES,& $4[facility] MED LIST PROVIDED ALSO.PATIENT VOICED UNDERSTANDING OF ALL INFO PROVIDED.NSG-CHARGE NURSE AWARE ALSO.

## 2012-10-14 NOTE — Progress Notes (Signed)
[facility] PROGRESS NOTE  Connor Mcpherson WGN:[id number] DOB: [birth year] DOA: 10/13/2012 PCP: Default, Provider, MD  Brief narrative: Connor Mcpherson is an 42 y.o. male with a past medical history of ulcerative colitis, not under the care of a primary care doctor or gastroenterologist, who presented to the hospital originally on 09/25/2012 with a flare of his ulcerative colitis. At that time, his symptoms included mild cramping but no fever, nausea, or vomiting. He was discharged on a course of prednisone, and completed the treatment course 10/12/12. The patient's symptoms returned approximately 2 days ago and today, he has had 5 episodes of diarrhea that was frankly bloody. He also has intermittent, diffuse, crampy abdominal pain which is rated a 7/10. There are no specific aggravating or alleviating factors. A CT scan was obtained in the emergency department which confirmed mild colitis. 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 Jarold Motto, dated 12/01/10). Apparently was discharged from Dr. Norval Gable care secondary to non-compliance. He spent 3 years in prison, during which time his symptoms were well controlled on low dose prednisone and 5-ASA.  Assessment/Plan: Principal Problem:  Ulcerative colitis with rectal bleeding  -Continue oral and rectal mesalamine.  -Gently hydrated, D/C IV fluids.  -1.7 g drop in hemoglobin over the past 24 hours. Recheck CBC in a.m.  -CRP, ESR not elevated.  -Check stool studies. Active Problems:  Non compliance with medical treatment  -Given his dismissal from Dr. Norval Gable service, and limited access to outpatient care, appropriate follow up will be challenging. Can use Azulfidine 1 gram BID and  prednisone burst for treatment (cheapest option) but really needs immunomodulatory therapy which will be difficult given his history of nonadherence and left payer source for medication. -Case manager consultation to assist with obtaining outpatient follow up. -Extensively counseled regarding the need for appropriate followup and regular visits with his treating physicians to adequately control his disease.  Hemorrhoids -Proctofoam and barrier cream as needed.  Code Status: Full.  Family Communication: Orlean Patten [phone] (mother)  Disposition Plan: Home when stable.  Medical Consultants:  None.  Other Consultants:  Dietician  Anti-infectives:  None.  HPI/Subjective: Connor Mcpherson feels worse today. He continues to have rectal bleeding and is describing rectal spasms and tenesmus. He tells me he was unable to keep the rectal suppository in for more than 3 minutes last night. He is afraid to eat anything for fear that it will run right through him. His hemorrhoids have flared up.  Objective: Filed Vitals:   10/13/12 1454 10/13/12 1511 10/13/12 2100 10/14/12 0500  BP: 120/60 139/83 127/72 108/63  Pulse: 72 67 70 75  Temp: 98.9 F (37.2 C) 98.1 F (36.7 C) 97.2 F (36.2 C) 97.4 F (36.3 C)  TempSrc: Oral Oral Oral Oral  Resp: 24 20 18 18   Height:  5\' 8"  (1.727 m)    Weight:  85.8 kg (189 lb 2.5 oz)  85.5 kg (188 lb 7.9 oz)  SpO2: 98% 100% 98% 100%    Intake/Output Summary (Last 24 hours) at 10/14/12 1108 Last data filed at 10/14/12 0845  Gross per 24 hour  Intake 1352.5 ml  Output      2 ml  Net 1350.5 ml    Exam: Gen:  NAD  Cardiovascular:  RRR, No M/R/G Respiratory:  Lungs CTAB Gastrointestinal:  Abdomen soft, NT/ND, + BS Extremities:  No C/E/C  Data Reviewed: Basic Metabolic Panel:  Recent Labs Lab 10/13/12 1030 10/14/12 0419  NA 136 137  K 3.5 4.1  CL 100 103  CO2 30 28  GLUCOSE 106* 96  BUN 18 14  CREATININE 1.09 1.16  CALCIUM 8.7 8.2*    GFR Estimated Creatinine Clearance: 87.3 ml/min (by C-G formula based on Cr of 1.16). Liver Function Tests:  Recent Labs Lab 10/13/12 1540  AST 15  ALT 13  ALKPHOS 62  BILITOT 0.6  PROT 6.5  ALBUMIN 3.0*   CBC:  Recent Labs Lab 10/13/12 1030 10/14/12 0419  WBC 7.6 6.7  NEUTROABS 4.5  --   HGB 15.7 14.0  HCT 45.3 41.5  MCV 89.5 89.2  PLT 286 240   Microbiology No results found for this or any previous visit (from the past 240 hour(s)).   Procedures and Diagnostic Studies: Ct Abdomen Pelvis W Contrast  10/13/2012  *RADIOLOGY REPORT*  Clinical Data: Abdominal pain.  History of colitis and diverticulitis with rectal bleeding.  CT ABDOMEN AND PELVIS WITH CONTRAST  Technique:  Multidetector CT imaging of the abdomen and pelvis was performed following the standard protocol during bolus administration of intravenous contrast.  Contrast: OMNIPAQUE IOHEXOL 300 MG/ML  SOLN  Comparison: 06/24/2010.  Findings: Mild diffuse low density of the liver relative to the spleen.  Mild diffuse low density wall thickening involving the distal transverse colon, splenic flexure, descending colon, sigmoid colon and rectum.  Minimal associated pericolonic soft tissue stranding.  No fluid collections or enlarged lymph nodes.  Moderately enlarged prostate gland.  Small right inguinal hernia containing fat.  Normal appearing spleen, pancreas, gallbladder, adrenal glands, kidneys and urinary bladder.  The bilateral sacroiliac joint degenerative changes.  Mild lumbar and lower thoracic spine degenerative changes.  IMPRESSION:  1.  Mild colitis involving the distal transverse colon, splenic flexure, descending colon, sigmoid colon and rectum, compatible with ulcerative colitis. 2.  Mild diffuse hepatic steatosis.   Original Report Authenticated By: Beckie Salts, M.D.     Scheduled Meds: . mesalamine  1,000 mg Rectal QHS  . mesalamine  1,000 mg Oral QID   Continuous Infusions:   Time spent: 35  minutes.   LOS: 1 day   RAMA,CHRISTINA  [facility] Pager [phone].  If 8PM-8AM, please contact night-coverage at [email or web address], password [facility] 10/14/2012, 11:08 AM

## 2012-10-14 NOTE — Progress Notes (Signed)
Utilization Review Completed.Connor Mcpherson T3/30/2014  

## 2012-10-14 NOTE — Progress Notes (Signed)
Lost IV access. Pt to d/c tomorrow, and was saline locked. Only IV meds were pain meds, and pt has been tolerating oral pain meds with adequate relief. Paged Dr. Darnelle Catalan and received order to d/c PIV.

## 2012-10-14 NOTE — ED Provider Notes (Signed)
Medical screening examination/treatment/procedure(s) were performed by non-physician practitioner and as supervising physician I was immediately available for consultation/collaboration.   Laray Anger, DO 10/14/12 1454

## 2012-10-15 LAB — CBC
Hemoglobin: 13.2 g/dL (ref 13.0–17.0)
MCHC: 33.2 g/dL (ref 30.0–36.0)
RDW: 13.7 % (ref 11.5–15.5)
WBC: 6.1 10*3/uL (ref 4.0–10.5)

## 2012-10-15 LAB — CLOSTRIDIUM DIFFICILE BY PCR: Toxigenic C. Difficile by PCR: NEGATIVE

## 2012-10-15 MED ORDER — HYOSCYAMINE SULFATE 0.125 MG SL SUBL: 0.2500 mg | SUBLINGUAL_TABLET | SUBLINGUAL | Status: AC | PRN

## 2012-10-15 MED ORDER — MESALAMINE ER 250 MG PO CPCR: 1000.0000 mg | ORAL_CAPSULE | Freq: Four times a day (QID) | ORAL | Status: DC

## 2012-10-15 MED ORDER — MESALAMINE 1000 MG RE SUPP: 1000.0000 mg | Freq: Every evening | RECTAL | Status: AC | PRN

## 2012-10-15 MED ORDER — OXYCODONE-ACETAMINOPHEN 5-325 MG PO TABS: 2.0000 | ORAL_TABLET | Freq: Four times a day (QID) | ORAL | Status: DC | PRN

## 2012-10-15 MED ORDER — PRAMOXINE HCL 1 % RE FOAM: RECTAL | Status: DC | PRN

## 2012-10-15 MED ORDER — MESALAMINE 1000 MG RE SUPP: 1000.0000 mg | Freq: Every day | RECTAL | Status: DC

## 2012-10-15 MED ORDER — MESALAMINE ER 250 MG PO CPCR: 1000.0000 mg | ORAL_CAPSULE | Freq: Four times a day (QID) | ORAL | Status: AC

## 2012-10-15 NOTE — Progress Notes (Signed)
Discharge instructions and prescriptions given.  Extra set of prescriptions given in case patient unable to get appointment with Dr. Ewing Schlein so he does not run out.  Left via walking with tech to his own car.  Edison Nasuti

## 2012-10-15 NOTE — Plan of Care (Signed)
Problem: Food- and Nutrition-Related Knowledge Deficit (NB-1.1) Goal: Nutrition education Formal process to instruct or train a patient/client in a skill or to impart knowledge to help patients/clients voluntarily manage or modify food choices and eating behavior to maintain or improve health. Outcome: Completed/Met Date Met:  10/15/12 Met with pt to discuss diet therapy for ulcerative colitis and flare ups. Discussed relationship between fiber and and diarrhea. Discussed sources of fiber in the diet and recommended low fiber options. Discussed importance of following low fiber diet during flare ups. Teach back method used. Pt expressed understanding. RD contact information provided. Handouts provided.

## 2012-10-15 NOTE — Discharge Summary (Signed)
Physician Discharge Summary  [name]:[id number] DOB: 05-06-1971 DOA: 10/13/2012  PCP: Default, Provider, MD  Admit date: 10/13/2012 Discharge date: 10/15/2012  Recommendations for Outpatient Follow-up:  1. Encouraged to followup with Dr. Ewing Schlein of gastroenterology for chronic disease management of his ulcerative colitis. 2. Followup final stool culture results.  Discharge Diagnoses:  Principal Problem:    Ulcerative colitis with rectal bleeding Active Problems:    Non compliance with medical treatment    Hemorrhoids   Discharge Condition: Improved.  Diet recommendation: Regular, as tolerated.  History of present illness:  Connor Mcpherson is an 42 y.o. male with a past medical history of ulcerative colitis, not under the care of a primary care doctor or gastroenterologist, who presented to the hospital originally on 09/25/2012 with a flare of his ulcerative colitis. At that time, his symptoms included mild cramping but no fever, nausea, or vomiting. He was discharged on a course of prednisone, and completed the treatment course 10/12/12. The patient's symptoms returned approximately 2 days ago and today, he has had 5 episodes of diarrhea that was frankly bloody. He also has intermittent, diffuse, crampy abdominal pain which is rated a 7/10. There are no specific aggravating or alleviating factors. A CT scan was obtained in the emergency department which confirmed mild colitis. Mr. 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 Jarold Motto, dated 12/01/10). Apparently was discharged from Dr. Norval Gable care secondary to non-compliance. He spent 3 years in prison, during which time his symptoms were well controlled on low dose  prednisone and 5-ASA.  Hospital Course by problem:  Principal Problem:  Ulcerative colitis with rectal bleeding  -Continue oral and rectal mesalamine.  -Gently hydrated.  -Hemoglobin trending down over the course of hospital stay but no evidence of brisk GI bleeding. -CRP, ESR not elevated.  -Clostridium difficile PCR studies negative. Stool cultures pending.  Active Problems:  Non compliance with medical treatment  -Given his dismissal from Dr. Norval Gable service, and limited access to outpatient care, appropriate follow up will be challenging. Can use Azulfidine 1 gram BID and prednisone burst for treatment (cheapest option) but really needs immunomodulatory therapy which will be difficult given his history of nonadherence and left payer source for medication.  -Case manager consultation to assist with obtaining outpatient follow up. Given contact information for Dr. Ewing Schlein. -Extensively counseled regarding the need for appropriate followup and regular visits with his treating physicians to adequately control his disease.  Hemorrhoids  -Proctofoam and barrier cream as needed.   Procedures:  None.  Consultations:  None.  Discharge Exam: Filed Vitals:   10/15/12 0538  BP: 117/71  Pulse: 60  Temp: 98.1 F (36.7 C)  Resp: 16   Filed Vitals:   10/14/12 0500 10/14/12 1329 10/14/12 2101 10/15/12 0538  BP: 108/63 129/92 137/80 117/71  Pulse: 75 67 64 60  Temp: 97.4 F (36.3 C) 98.2 F (36.8 C) 97.8 F (36.6 C) 98.1 F (36.7 C)  TempSrc: Oral Oral Oral Oral  Resp: 18 16 18 16   Height:      Weight: 85.5 kg (188 lb 7.9 oz)     SpO2: 100% 98% 100% 99%    Gen:  NAD Cardiovascular:  RRR, No M/R/G Respiratory: Lungs CTAB Gastrointestinal: Abdomen soft, NT/ND  with normal active bowel sounds. Extremities: No C/E/C   Discharge Instructions  Discharge Orders   Future Orders Complete By Expires     Activity as tolerated - No restrictions  As directed     Diet general  As  directed     Discharge instructions  As directed     Comments:      It is imperative that you followup with a gastroenterologist for long-term management of your ulcerative colitis.        Medication List    STOP taking these medications       predniSONE 20 MG tablet  Commonly known as:  DELTASONE      TAKE these medications       hyoscyamine 0.125 MG SL tablet  Commonly known as:  LEVSIN SL  Place 2 tablets (0.25 mg total) under the tongue every 4 (four) hours as needed for cramping.     mesalamine 250 MG CR capsule  Commonly known as:  PENTASA  Take 4 capsules (1,000 mg total) by mouth 4 (four) times daily.     mesalamine 1000 MG suppository  Commonly known as:  CANASA  Place 1 suppository (1,000 mg total) rectally at bedtime as needed. Use as needed for UC flare.     oxyCODONE-acetaminophen 5-325 MG per tablet  Commonly known as:  PERCOCET  Take 2 tablets by mouth every 6 (six) hours as needed for pain.     pramoxine 1 % foam  Commonly known as:  PROCTOFOAM  Place rectally every 4 (four) hours as needed.           Follow-up Information   Schedule an appointment as soon as possible for a visit with [facility], MD. (M-F 8a-5p call for appt)    Contact information:    [address]  [place] [address]  [phone]        The results of significant diagnostics from this hospitalization (including imaging, microbiology, ancillary and laboratory) are listed below for reference.    Significant Diagnostic Studies: Ct Abdomen Pelvis W Contrast  10/13/2012  *RADIOLOGY REPORT*  Clinical Data: Abdominal pain.  History of colitis and diverticulitis with rectal bleeding.  CT ABDOMEN AND PELVIS WITH CONTRAST  Technique:  Multidetector CT imaging of the abdomen and pelvis was performed following the standard protocol during bolus administration of intravenous contrast.  Contrast: OMNIPAQUE IOHEXOL 300 MG/ML  SOLN  Comparison: 06/24/2010.  Findings:  Mild diffuse low density of the liver relative to the spleen.  Mild diffuse low density wall thickening involving the distal transverse colon, splenic flexure, descending colon, sigmoid colon and rectum.  Minimal associated pericolonic soft tissue stranding.  No fluid collections or enlarged lymph nodes.  Moderately enlarged prostate gland.  Small right inguinal hernia containing fat.  Normal appearing spleen, pancreas, gallbladder, adrenal glands, kidneys and urinary bladder.  The bilateral sacroiliac joint degenerative changes.  Mild lumbar and lower thoracic spine degenerative changes.  IMPRESSION:  1.  Mild colitis involving the distal transverse colon, splenic flexure, descending colon, sigmoid colon and rectum, compatible with ulcerative colitis. 2.  Mild diffuse hepatic steatosis.   Original Report Authenticated By: Beckie Salts, M.D.     Labs:  Basic Metabolic Panel:  Recent Labs Lab 10/13/12 1030 10/14/12 0419  NA 136 137  K 3.5 4.1  CL 100 103  CO2 30 28  GLUCOSE 106* 96  BUN 18 14  CREATININE 1.09 1.16  CALCIUM 8.7 8.2*   GFR Estimated  Creatinine Clearance: 87.3 ml/min (by C-G formula based on Cr of 1.16). Liver Function Tests:  Recent Labs Lab 10/13/12 1540  AST 15  ALT 13  ALKPHOS 62  BILITOT 0.6  PROT 6.5  ALBUMIN 3.0*   CBC:  Recent Labs Lab 10/13/12 1030 10/14/12 0419 10/15/12 0355  WBC 7.6 6.7 6.1  NEUTROABS 4.5  --   --   HGB 15.7 14.0 13.2  HCT 45.3 41.5 39.8  MCV 89.5 89.2 88.6  PLT 286 240 239    Microbiology Recent Results (from the past 240 hour(s))  CLOSTRIDIUM DIFFICILE BY PCR     Status: None   Collection Time    10/15/12  6:30 AM      Result Value Range Status   C difficile by pcr NEGATIVE  NEGATIVE Final   Markers of inflammation:  Ref. Range 10/13/2012 15:40  Sed Rate Latest Range: 0-16 mm/hr 1    Ref. Range 10/13/2012 15:40  CRP Latest Range: <0.60 mg/dL <9.6 (L)   Time coordinating discharge: 35  minutes.  Signed:  RAMA,CHRISTINA  Pager [phone] [facility] 10/15/2012, 11:11 AM

## 2012-10-19 LAB — STOOL CULTURE

## 2012-12-20 ENCOUNTER — Emergency Department (HOSPITAL_BASED_OUTPATIENT_CLINIC_OR_DEPARTMENT_OTHER): Payer: Self-pay

## 2012-12-20 ENCOUNTER — Emergency Department (HOSPITAL_BASED_OUTPATIENT_CLINIC_OR_DEPARTMENT_OTHER)
Admission: EM | Admit: 2012-12-20 | Discharge: 2012-12-20 | Disposition: A | Discharge status: Home / Self Care | Payer: Self-pay | Attending: Emergency Medicine | Admitting: Emergency Medicine

## 2012-12-20 ENCOUNTER — Encounter (HOSPITAL_BASED_OUTPATIENT_CLINIC_OR_DEPARTMENT_OTHER): Payer: Self-pay | Admitting: *Deleted

## 2012-12-20 DIAGNOSIS — K4090 Unilateral inguinal hernia, without obstruction or gangrene, not specified as recurrent: Secondary | ICD-10-CM | POA: Insufficient documentation

## 2012-12-20 DIAGNOSIS — F17200 Nicotine dependence, unspecified, uncomplicated: Secondary | ICD-10-CM | POA: Insufficient documentation

## 2012-12-20 DIAGNOSIS — Z79899 Other long term (current) drug therapy: Secondary | ICD-10-CM | POA: Insufficient documentation

## 2012-12-20 DIAGNOSIS — N509 Disorder of male genital organs, unspecified: Secondary | ICD-10-CM | POA: Insufficient documentation

## 2012-12-20 DIAGNOSIS — R110 Nausea: Secondary | ICD-10-CM | POA: Insufficient documentation

## 2012-12-20 DIAGNOSIS — Z8719 Personal history of other diseases of the digestive system: Secondary | ICD-10-CM | POA: Insufficient documentation

## 2012-12-20 LAB — URINALYSIS, ROUTINE W REFLEX MICROSCOPIC
Leukocytes, UA: NEGATIVE
Protein, ur: NEGATIVE mg/dL
Urobilinogen, UA: 1 mg/dL (ref 0.0–1.0)

## 2012-12-20 MED ORDER — SODIUM CHLORIDE 0.9 % IV SOLN: Freq: Once | INTRAVENOUS | Status: DC

## 2012-12-20 MED ORDER — SODIUM CHLORIDE 0.9 % IV BOLUS (SEPSIS): 1000.0000 mL | Freq: Once | INTRAVENOUS | Status: DC

## 2012-12-20 NOTE — ED Provider Notes (Signed)
History     CSN: [id number]  Arrival date & time 12/20/12  1943   First MD Initiated Contact with Patient 12/20/12 2000      Chief Complaint  Patient presents with  . Abdominal Pain    (Consider location/radiation/quality/duration/timing/severity/associated sxs/prior treatment) HPI Comments: Pt comes in with cc of abd pain. Pt has hx of right sided inguinal hernia. 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 The hernia typically reduces on its own, but not this time. He has some nausea, and the pain is diffuse in the perineal area, including scrotum.  Patient is a 42 y.o. male presenting with abdominal pain. The history is provided by the patient.  Abdominal Pain Associated symptoms include abdominal pain. Pertinent negatives include no chest pain and no shortness of breath.    Past Medical History  Diagnosis Date  . Ulcerative colitis   . Diverticulitis     Past Surgical History  Procedure Laterality Date  . Knee arthroscopy    . Hernia repair      Family History  Problem Relation Age of Onset  . Cancer Neg Hx   . Diabetes Neg Hx   . Heart failure Neg Hx   . Ulcerative colitis Neg Hx   . Crohn's disease Neg Hx     History  Substance Use Topics  . Smoking status: Current Every Day Smoker -- 0.50 packs/day for 12.5 years    Types: Cigarettes  . Smokeless tobacco: Never Used  . Alcohol Use: No     Comment: No ETOH in 2 weeks; Prior 2-3 beers every 2-3 weeks.      Review of Systems  Constitutional: Negative for activity change and appetite change.  Respiratory: Negative for cough and shortness of breath.   Cardiovascular: Negative for chest pain.  Gastrointestinal: Positive for nausea and abdominal pain. Negative for vomiting and diarrhea.  Genitourinary: Positive for testicular pain. Negative for dysuria, frequency, hematuria and flank pain.    Allergies  Review of patient's  allergies indicates no known allergies.  Home Medications   Current Outpatient Rx  Name  Route  Sig  Dispense  Refill  . hyoscyamine (LEVSIN SL) 0.125 MG SL tablet   Sublingual   Place 2 tablets (0.25 mg total) under the tongue every 4 (four) hours as needed for cramping.   60 tablet   0   . mesalamine (CANASA) 1000 MG suppository   Rectal   Place 1 suppository (1,000 mg total) rectally at bedtime as needed. Use as needed for UC flare.   30 suppository   3   . mesalamine (PENTASA) 250 MG CR capsule   Oral   Take 4 capsules (1,000 mg total) by mouth 4 (four) times daily.   136 capsule   3   . oxyCODONE-acetaminophen (PERCOCET) 5-325 MG per tablet   Oral   Take 2 tablets by mouth every 6 (six) hours as needed for pain.   30 tablet   0   . pramoxine (PROCTOFOAM) 1 % foam   Rectal   Place rectally every 4 (four) hours as needed.   15 g   3     BP 168/108  Pulse 80  Temp(Src) 98.1 F (36.7 C) (Oral)  Resp 16  Ht 5\' 8"  (1.727 m)  Wt 195 lb (88.451 kg)  BMI 29.66 kg/m2  SpO2 99%  Physical Exam  Nursing note reviewed. Constitutional: He is oriented to  person, place, and time. He appears well-developed.  HENT:  Head: Normocephalic and atraumatic.  Eyes: Conjunctivae and EOM are normal. Pupils are equal, round, and reactive to light.  Neck: Normal range of motion. Neck supple.  Cardiovascular: Normal rate and regular rhythm.   Pulmonary/Chest: Effort normal and breath sounds normal.  Abdominal: Soft. Bowel sounds are normal. He exhibits no distension. There is tenderness. There is no rebound and no guarding.  Pt has an inguinal hernia, no skin discoloration overlying the area  Neurological: He is alert and oriented to person, place, and time.  Skin: Skin is warm.    ED Course  Hernia reduction Date/Time: 12/20/2012 9:25 PM Performed by: Derwood Kaplan Authorized by: Derwood Kaplan Consent: Verbal consent obtained. Risks and benefits: risks, benefits and  alternatives were discussed Patient identity confirmed: verbally with patient Local anesthesia used: no Patient sedated: no Patient tolerance: Patient tolerated the procedure well with no immediate complications. Comments: The hernia reduced with firm pressure for about 15 seconds.   (including critical care time)  Labs Reviewed  URINALYSIS, ROUTINE W REFLEX MICROSCOPIC - Abnormal; Notable for the following:    Specific Gravity, Urine 1.037 (*)    Bilirubin Urine SMALL (*)    All other components within normal limits   Dg Abd Acute W/chest  12/20/2012   *RADIOLOGY REPORT*  Clinical Data: Abdominal pain  ACUTE ABDOMEN SERIES (ABDOMEN 2 VIEW & CHEST 1 VIEW)  Comparison:  CT 10/13/2012  Findings:  There is no evidence of dilated bowel loops or free intraperitoneal air.  No radiopaque calculi or other significant radiographic abnormality is seen. Heart size and mediastinal contours are within normal limits.  Both lungs are clear. Evidence of anterior abdominal wall mesh repair.  IMPRESSION: Negative abdominal radiographs.  No acute cardiopulmonary disease.   Original Report Authenticated By: Christiana Pellant, M.D.     No diagnosis found.    MDM  Pt comes in with cc of RLQ abd pain. Has hx of hernia, and the hernia was bulging out during the exam. Pt likely had some inflammatory pain due to the hernia - which upon reduction of hernia resolved. We have observed patient for an hour post reduction, continues to do well. Will discharge.   Derwood Kaplan, MD 12/20/12 2128

## 2012-12-20 NOTE — ED Notes (Signed)
Right lower quad pain since this am. Hx of hernia and states he has pain on and off. Has not been able to reduce the hernia today.

## 2013-01-05 ENCOUNTER — Encounter (HOSPITAL_COMMUNITY): Payer: Self-pay | Admitting: Physical Medicine and Rehabilitation

## 2013-01-05 ENCOUNTER — Emergency Department (HOSPITAL_COMMUNITY)
Admission: EM | Admit: 2013-01-05 | Discharge: 2013-01-05 | Disposition: A | Discharge status: Home / Self Care | Payer: Self-pay | Attending: Emergency Medicine | Admitting: Emergency Medicine

## 2013-01-05 DIAGNOSIS — F17200 Nicotine dependence, unspecified, uncomplicated: Secondary | ICD-10-CM | POA: Insufficient documentation

## 2013-01-05 DIAGNOSIS — M545 Low back pain, unspecified: Secondary | ICD-10-CM | POA: Insufficient documentation

## 2013-01-05 DIAGNOSIS — M549 Dorsalgia, unspecified: Secondary | ICD-10-CM

## 2013-01-05 DIAGNOSIS — Z8719 Personal history of other diseases of the digestive system: Secondary | ICD-10-CM | POA: Insufficient documentation

## 2013-01-05 DIAGNOSIS — K5190 Ulcerative colitis, unspecified, without complications: Secondary | ICD-10-CM | POA: Insufficient documentation

## 2013-01-05 DIAGNOSIS — M25559 Pain in unspecified hip: Secondary | ICD-10-CM | POA: Insufficient documentation

## 2013-01-05 MED ORDER — OXYCODONE-ACETAMINOPHEN 5-325 MG PO TABS
2.0000 | ORAL_TABLET | Freq: Once | ORAL | Status: AC
  Administered 2013-01-05: 2 via ORAL
  Filled 2013-01-05: qty 2

## 2013-01-05 MED ORDER — KETOROLAC TROMETHAMINE 60 MG/2ML IM SOLN
60.0000 mg | Freq: Once | INTRAMUSCULAR | Status: AC
  Administered 2013-01-05: 60 mg via INTRAMUSCULAR
  Filled 2013-01-05: qty 2

## 2013-01-05 MED ORDER — HYDROCODONE-ACETAMINOPHEN 5-325 MG PO TABS: 2.0000 | ORAL_TABLET | Freq: Four times a day (QID) | ORAL | Status: DC | PRN

## 2013-01-05 MED ORDER — PREDNISONE 20 MG PO TABS: 40.0000 mg | ORAL_TABLET | Freq: Every day | ORAL | Status: DC

## 2013-01-05 NOTE — ED Notes (Signed)
Pt states L lower back and hip pain. Ongoing since Thursday. 8/10 upon arrival, becomes worse with movement. Denies recent trauma. Pt is alert and oriented x4. No signs of distress noted at the time.

## 2013-01-05 NOTE — ED Provider Notes (Signed)
History     CSN: [id number]  Arrival date & time 01/05/13  1478   First MD Initiated Contact with Patient 01/05/13 [phone]      Chief Complaint  Patient presents with  . Back Pain  . Hip Pain    (Consider location/radiation/quality/duration/timing/severity/associated sxs/prior treatment) HPI Comments: Patient presents to the emergency department with chief complaint of left low back pain. He states the pain has been ongoing since Thursday. States that his pain is 8/10, and is worsened with movement. 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 He is tried taking Aleve and ibuprofen for his pain with no relief. States that the pain is improved with flexion, and worsened with extension. He denies any bowel or bladder incontinence, denies any saddle anesthesia.  The history is provided by the patient. No language interpreter was used.    Past Medical History  Diagnosis Date  . Ulcerative colitis   . Diverticulitis     Past Surgical History  Procedure Laterality Date  . Knee arthroscopy    . Hernia repair      Family History  Problem Relation Age of Onset  . Cancer Neg Hx   . Diabetes Neg Hx   . Heart failure Neg Hx   . Ulcerative colitis Neg Hx   . Crohn's disease Neg Hx     History  Substance Use Topics  . Smoking status: Current Every Day Smoker -- 0.50 packs/day for 12.5 years    Types: Cigarettes  . Smokeless tobacco: Never Used  . Alcohol Use: No     Comment: No ETOH in 2 weeks; Prior 2-3 beers every 2-3 weeks.      Review of Systems  All other systems reviewed and are negative.    Allergies  Review of patient's allergies indicates no known allergies.  Home Medications   Current Outpatient Rx  Name  Route  Sig  Dispense  Refill  . hyoscyamine (LEVSIN SL) 0.125 MG SL tablet   Sublingual   Place 2 tablets (0.25 mg total) under the tongue every 4 (four) hours as needed for cramping.   60  tablet   0   . mesalamine (CANASA) 1000 MG suppository   Rectal   Place 1 suppository (1,000 mg total) rectally at bedtime as needed. Use as needed for UC flare.   30 suppository   3   . mesalamine (PENTASA) 250 MG CR capsule   Oral   Take 4 capsules (1,000 mg total) by mouth 4 (four) times daily.   136 capsule   3   . oxyCODONE-acetaminophen (PERCOCET) 5-325 MG per tablet   Oral   Take 2 tablets by mouth every 6 (six) hours as needed for pain.   30 tablet   0   . pramoxine (PROCTOFOAM) 1 % foam   Rectal   Place rectally every 4 (four) hours as needed.   15 g   3     BP 139/90  Pulse 59  Temp(Src) 98.2 F (36.8 C) (Oral)  Resp 16  SpO2 100%  Physical Exam  Nursing note and vitals reviewed. Constitutional: He is oriented to person, place, and time. He appears well-developed and well-nourished. No distress.  HENT:  Head: Normocephalic and atraumatic.  Eyes: Conjunctivae and EOM are normal. Right eye exhibits no discharge. Left eye exhibits no discharge. No scleral icterus.  Neck: Normal range of motion. Neck supple. No tracheal deviation present.  Cardiovascular: Normal rate,  regular rhythm and normal heart sounds.  Exam reveals no gallop and no friction rub.   No murmur heard. Pulmonary/Chest: Effort normal and breath sounds normal. No respiratory distress. He has no wheezes.  Abdominal: Soft. He exhibits no distension. There is no tenderness.  Musculoskeletal: Normal range of motion.  Lumbar paraspinal muscles tender to palpation, tenderness to palpation of the left SI, no step-offs, or gross abnormality or deformity of spine, patient is able to ambulate, moves all extremities  Neurological: He is alert and oriented to person, place, and time.  Sensation and strength intact bilaterally  Skin: Skin is warm. He is not diaphoretic.  Psychiatric: He has a normal mood and affect. His behavior is normal. Judgment and thought content normal.    ED Course  Procedures  (including critical care time)  Labs Reviewed - No data to display No results found.   1. Back pain       MDM  Patient with low back pain, possibly SI joint pain, will give toradol shot.  8:55 AM Patient is feeling better after toradol shot and percocet.  Patient with back pain.  No neurological deficits and normal neuro exam.  Patient can walk but states is painful.  No loss of bowel or bladder control.  No concern for cauda equina.  No fever, night sweats, weight loss, h/o cancer, IVDU.  RICE protocol and pain medicine indicated and discussed with patient.          Roxy Horseman, PA-C 01/05/13 [phone]

## 2013-01-06 NOTE — ED Provider Notes (Signed)
Medical screening examination/treatment/procedure(s) were performed by non-physician practitioner and as supervising physician I was immediately available for consultation/collaboration.   Richardean Canal, MD 01/06/13 [phone]

## 2013-03-11 ENCOUNTER — Emergency Department (HOSPITAL_BASED_OUTPATIENT_CLINIC_OR_DEPARTMENT_OTHER): Payer: Self-pay

## 2013-03-11 ENCOUNTER — Encounter (HOSPITAL_BASED_OUTPATIENT_CLINIC_OR_DEPARTMENT_OTHER): Payer: Self-pay | Admitting: Emergency Medicine

## 2013-03-11 ENCOUNTER — Emergency Department (HOSPITAL_BASED_OUTPATIENT_CLINIC_OR_DEPARTMENT_OTHER)
Admission: EM | Admit: 2013-03-11 | Discharge: 2013-03-11 | Disposition: A | Discharge status: Home / Self Care | Payer: Self-pay | Attending: Emergency Medicine | Admitting: Emergency Medicine

## 2013-03-11 DIAGNOSIS — K4090 Unilateral inguinal hernia, without obstruction or gangrene, not specified as recurrent: Secondary | ICD-10-CM | POA: Insufficient documentation

## 2013-03-11 DIAGNOSIS — N50812 Left testicular pain: Secondary | ICD-10-CM

## 2013-03-11 DIAGNOSIS — Z87442 Personal history of urinary calculi: Secondary | ICD-10-CM | POA: Insufficient documentation

## 2013-03-11 DIAGNOSIS — F17200 Nicotine dependence, unspecified, uncomplicated: Secondary | ICD-10-CM | POA: Insufficient documentation

## 2013-03-11 DIAGNOSIS — Z79899 Other long term (current) drug therapy: Secondary | ICD-10-CM | POA: Insufficient documentation

## 2013-03-11 DIAGNOSIS — N5089 Other specified disorders of the male genital organs: Secondary | ICD-10-CM | POA: Insufficient documentation

## 2013-03-11 DIAGNOSIS — Z8719 Personal history of other diseases of the digestive system: Secondary | ICD-10-CM | POA: Insufficient documentation

## 2013-03-11 DIAGNOSIS — N509 Disorder of male genital organs, unspecified: Secondary | ICD-10-CM | POA: Insufficient documentation

## 2013-03-11 HISTORY — DX: Calculus of kidney: N20.0

## 2013-03-11 LAB — URINALYSIS, ROUTINE W REFLEX MICROSCOPIC
Bilirubin Urine: NEGATIVE
Glucose, UA: NEGATIVE mg/dL
Ketones, ur: NEGATIVE mg/dL
Leukocytes, UA: NEGATIVE
Nitrite: NEGATIVE
Protein, ur: NEGATIVE mg/dL
pH: 7.5 (ref 5.0–8.0)

## 2013-03-11 MED ORDER — SULFAMETHOXAZOLE-TRIMETHOPRIM 800-160 MG PO TABS: 1.0000 | ORAL_TABLET | Freq: Two times a day (BID) | ORAL | Status: DC

## 2013-03-11 MED ORDER — HYDROCODONE-ACETAMINOPHEN 5-325 MG PO TABS: 2.0000 | ORAL_TABLET | ORAL | Status: DC | PRN

## 2013-03-11 NOTE — ED Notes (Signed)
Pt walked in to ED via POV.  Pt able to answer all questions.  Pt calm.  Pt c/o of pain in RL abdomen with pain in testicles.

## 2013-03-11 NOTE — ED Provider Notes (Signed)
CSN: [id number]     Arrival date & time 03/11/13  1056 History     First MD Initiated Contact with Patient 03/11/13 1123     Chief Complaint  Patient presents with  . Inguinal Hernia   (Consider location/radiation/quality/duration/timing/severity/associated sxs/prior Treatment) Patient is a 42 y.o. male presenting with testicular pain. The history is provided by the patient. No language interpreter was used.  Testicle Pain This is a new problem. The current episode started today. The problem occurs constantly. Associated symptoms include abdominal pain. Nothing aggravates the symptoms. He has tried nothing for the symptoms. The treatment provided moderate relief.   Pt complains of pain and swelling in his testicles.  Pt reports he has a hernia and is scheduled for evaluation 9/11 for repair Past Medical History  Diagnosis Date  . Ulcerative colitis   . Diverticulitis   . Kidney stone    Past Surgical History  Procedure Laterality Date  . Knee arthroscopy    . Hernia repair     Family History  Problem Relation Age of Onset  . Cancer Neg Hx   . Diabetes Neg Hx   . Heart failure Neg Hx   . Ulcerative colitis Neg Hx   . Crohn's disease Neg Hx    History  Substance Use Topics  . Smoking status: Current Every Day Smoker -- 0.50 packs/day for 12.5 years    Types: Cigarettes  . Smokeless tobacco: Never Used  . Alcohol Use: No     Comment: No ETOH in 2 weeks; Prior 2-3 beers every 2-3 weeks.    Review of Systems  Gastrointestinal: Positive for abdominal pain.  Genitourinary: Positive for testicular pain.  All other systems reviewed and are negative.    Allergies  Review of patient's allergies indicates no known allergies.  Home Medications   Current Outpatient Rx  Name  Route  Sig  Dispense  Refill  . Multiple Vitamin (MULTIVITAMIN) tablet   Oral   Take 1 tablet by mouth daily.         Marland Kitchen HYDROcodone-acetaminophen (NORCO/VICODIN) 5-325 MG per tablet   Oral  Take 2 tablets by mouth every 6 (six) hours as needed for pain.   15 tablet   0   . hyoscyamine (LEVSIN SL) 0.125 MG SL tablet   Sublingual   Place 2 tablets (0.25 mg total) under the tongue every 4 (four) hours as needed for cramping.   60 tablet   0   . mesalamine (CANASA) 1000 MG suppository   Rectal   Place 1 suppository (1,000 mg total) rectally at bedtime as needed. Use as needed for UC flare.   30 suppository   3   . mesalamine (PENTASA) 250 MG CR capsule   Oral   Take 4 capsules (1,000 mg total) by mouth 4 (four) times daily.   136 capsule   3   . predniSONE (DELTASONE) 20 MG tablet   Oral   Take 2 tablets (40 mg total) by mouth daily.   10 tablet   0    BP 119/60  Pulse 62  Temp(Src) 97.6 F (36.4 C) (Oral)  Resp 20  Ht 5\' 8"  (1.727 m)  Wt 180 lb (81.647 kg)  BMI 27.38 kg/m2  SpO2 99% Physical Exam  Nursing note and vitals reviewed. Constitutional: He is oriented to person, place, and time. He appears well-developed and well-nourished.  HENT:  Head: Normocephalic and atraumatic.  Eyes: Conjunctivae and EOM are normal. Pupils are equal, round, and reactive to  light.  Neck: Normal range of motion. Neck supple.  Cardiovascular: Normal rate.   Pulmonary/Chest: Effort normal and breath sounds normal.  Abdominal: Soft. Bowel sounds are normal.  Genitourinary:  Tender left testicle no mass,  No erythema,  Appears slightly swollen in comparison to right  Musculoskeletal: Normal range of motion.  Neurological: He is alert and oriented to person, place, and time. He has normal reflexes.  Skin: Skin is warm.  Psychiatric: He has a normal mood and affect.    ED Course   Procedures (including critical care time)  Labs Reviewed  URINALYSIS, ROUTINE W REFLEX MICROSCOPIC   No results found. 1. Testicular pain, left   2. Inguinal hernia, right    Results for orders placed during the hospital encounter of 03/11/13  URINALYSIS, ROUTINE W REFLEX MICROSCOPIC       Result Value Range   Color, Urine YELLOW  YELLOW   APPearance CLEAR  CLEAR   Specific Gravity, Urine 1.019  1.005 - 1.030   pH 7.5  5.0 - 8.0   Glucose, UA NEGATIVE  NEGATIVE mg/dL   Hgb urine dipstick NEGATIVE  NEGATIVE   Bilirubin Urine NEGATIVE  NEGATIVE   Ketones, ur NEGATIVE  NEGATIVE mg/dL   Protein, ur NEGATIVE  NEGATIVE mg/dL   Urobilinogen, UA 0.2  0.0 - 1.0 mg/dL   Nitrite NEGATIVE  NEGATIVE   Leukocytes, UA NEGATIVE  NEGATIVE   US Scrotum  03/11/2013   *RADIOLOGY REPORT*  Clinical Data:  Bilateral testicular swelling and pain.  Right lower quadrant pain.  SCROTAL ULTRASOUND DOPPLER ULTRASOUND OF THE TESTICLES  Technique: Complete ultrasound examination of the testicles, epididymis, and other scrotal structures was performed.  Color and spectral Doppler ultrasound were also utilized to evaluate blood flow to the testicles.  Comparison:  None.  Findings:  Right testis:  Measures 4.3 x 2.5 x 2.9 cm.  Contains a focal hypoechoic lesion with posterior acoustic shadowing and peripheral calcification, measuring 4 mm.  Parenchymal echogenicity is otherwise uniform. Color Doppler flow is identified.  Left testis:  Measures 4.3 x 2.2 x 2.6 cm.  Parenchymal echogenicity is uniform.  Color Doppler flow is identified.  Right epididymis:  Normal.  Left epididymis:  Normal.  Hydrocele:  Absent.  Varicocele:  Present on the left.  Pulsed Doppler interrogation of both testes demonstrates  low resistance arterial and venous wave forms bilaterally.  IMPRESSION: 1.  Tiny hypoechoic lesion in the right testicle, nonspecific.  Non emergent urology consultation should be considered as neoplasm cannot be definitively excluded. 2.  Left varicocele.   Original Report Authenticated By: Leanna Battles, M.D.   [place] Art/ven Flow Abd Pelv Doppler  03/11/2013   *RADIOLOGY REPORT*  Clinical Data:  Bilateral testicular swelling and pain.  Right lower quadrant pain.  SCROTAL ULTRASOUND DOPPLER ULTRASOUND OF THE TESTICLES   Technique: Complete ultrasound examination of the testicles, epididymis, and other scrotal structures was performed.  Color and spectral Doppler ultrasound were also utilized to evaluate blood flow to the testicles.  Comparison:  None.  Findings:  Right testis:  Measures 4.3 x 2.5 x 2.9 cm.  Contains a focal hypoechoic lesion with posterior acoustic shadowing and peripheral calcification, measuring 4 mm.  Parenchymal echogenicity is otherwise uniform. Color Doppler flow is identified.  Left testis:  Measures 4.3 x 2.2 x 2.6 cm.  Parenchymal echogenicity is uniform.  Color Doppler flow is identified.  Right epididymis:  Normal.  Left epididymis:  Normal.  Hydrocele:  Absent.  Varicocele:  Present on  the left.  Pulsed Doppler interrogation of both testes demonstrates  low resistance arterial and venous wave forms bilaterally.  IMPRESSION: 1.  Tiny hypoechoic lesion in the right testicle, nonspecific.  Non emergent urology consultation should be considered as neoplasm cannot be definitively excluded. 2.  Left varicocele.   Original Report Authenticated By: Leanna Battles, M.D.    MDM  Pt advised follow up with Urologist for evaluation.   RX for hydrocodone and bactrim.   Keep appointment for hernia evaluation  Elson Areas, PA-C 03/11/13 1431

## 2013-03-11 NOTE — ED Notes (Signed)
MD at bedside. 

## 2013-03-14 NOTE — ED Provider Notes (Signed)
History/physical exam/procedure(s) were performed by non-physician practitioner and as supervising physician I was immediately available for consultation/collaboration. I have reviewed all notes and am in agreement with care and plan.   Hilario Quarry, MD 03/14/13 [phone]

## 2014-03-04 ENCOUNTER — Emergency Department (HOSPITAL_BASED_OUTPATIENT_CLINIC_OR_DEPARTMENT_OTHER)
Admission: EM | Admit: 2014-03-04 | Discharge: 2014-03-04 | Disposition: A | Discharge status: Home / Self Care | Payer: PRIVATE HEALTH INSURANCE | Attending: Emergency Medicine | Admitting: Emergency Medicine

## 2014-03-04 ENCOUNTER — Encounter (HOSPITAL_BASED_OUTPATIENT_CLINIC_OR_DEPARTMENT_OTHER): Payer: Self-pay | Admitting: Emergency Medicine

## 2014-03-04 DIAGNOSIS — IMO0002 Reserved for concepts with insufficient information to code with codable children: Secondary | ICD-10-CM | POA: Diagnosis not present

## 2014-03-04 DIAGNOSIS — Z79899 Other long term (current) drug therapy: Secondary | ICD-10-CM | POA: Insufficient documentation

## 2014-03-04 DIAGNOSIS — F17200 Nicotine dependence, unspecified, uncomplicated: Secondary | ICD-10-CM | POA: Diagnosis not present

## 2014-03-04 DIAGNOSIS — Z87442 Personal history of urinary calculi: Secondary | ICD-10-CM | POA: Diagnosis not present

## 2014-03-04 DIAGNOSIS — R1031 Right lower quadrant pain: Secondary | ICD-10-CM | POA: Diagnosis not present

## 2014-03-04 DIAGNOSIS — R109 Unspecified abdominal pain: Secondary | ICD-10-CM | POA: Insufficient documentation

## 2014-03-04 DIAGNOSIS — Z792 Long term (current) use of antibiotics: Secondary | ICD-10-CM | POA: Insufficient documentation

## 2014-03-04 DIAGNOSIS — Z8719 Personal history of other diseases of the digestive system: Secondary | ICD-10-CM | POA: Diagnosis not present

## 2014-03-04 DIAGNOSIS — R197 Diarrhea, unspecified: Secondary | ICD-10-CM | POA: Insufficient documentation

## 2014-03-04 LAB — URINALYSIS, ROUTINE W REFLEX MICROSCOPIC
Bilirubin Urine: NEGATIVE
Glucose, UA: NEGATIVE mg/dL
Hgb urine dipstick: NEGATIVE
Ketones, ur: NEGATIVE mg/dL
LEUKOCYTES UA: NEGATIVE
NITRITE: NEGATIVE
PH: 8 (ref 5.0–8.0)
Protein, ur: NEGATIVE mg/dL
SPECIFIC GRAVITY, URINE: 1.019 (ref 1.005–1.030)
UROBILINOGEN UA: 1 mg/dL (ref 0.0–1.0)

## 2014-03-04 LAB — COMPREHENSIVE METABOLIC PANEL
ALK PHOS: 61 U/L (ref 39–117)
ALT: 15 U/L (ref 0–53)
AST: 19 U/L (ref 0–37)
Albumin: 3.6 g/dL (ref 3.5–5.2)
Anion gap: 12 (ref 5–15)
BILIRUBIN TOTAL: 0.4 mg/dL (ref 0.3–1.2)
BUN: 21 mg/dL (ref 6–23)
CHLORIDE: 103 meq/L (ref 96–112)
CO2: 23 meq/L (ref 19–32)
CREATININE: 1 mg/dL (ref 0.50–1.35)
Calcium: 8.9 mg/dL (ref 8.4–10.5)
GFR calc Af Amer: >90 mL/min (ref >90)
GFR, EST NON AFRICAN AMERICAN: 90 mL/min — AB (ref >90)
Glucose, Bld: 126 mg/dL — ABNORMAL HIGH (ref 70–99)
POTASSIUM: 4.5 meq/L (ref 3.7–5.3)
SODIUM: 138 meq/L (ref 137–147)
Total Protein: 6.9 g/dL (ref 6.0–8.3)

## 2014-03-04 LAB — CBC
HCT: 45.7 % (ref 39.0–52.0)
Hemoglobin: 15.8 g/dL (ref 13.0–17.0)
MCH: 30 pg (ref 26.0–34.0)
MCHC: 34.6 g/dL (ref 30.0–36.0)
MCV: 86.7 fL (ref 78.0–100.0)
PLATELETS: 233 10*3/uL (ref 150–400)
RBC: 5.27 MIL/uL (ref 4.22–5.81)
RDW: 13.1 % (ref 11.5–15.5)
WBC: 7.8 10*3/uL (ref 4.0–10.5)

## 2014-03-04 MED ORDER — SODIUM CHLORIDE 0.9 % IV BOLUS (SEPSIS)
1000.0000 mL | Freq: Once | INTRAVENOUS | Status: AC
  Administered 2014-03-04: 1000 mL via INTRAVENOUS

## 2014-03-04 NOTE — ED Provider Notes (Signed)
CSN: [id number]     Arrival date & time 03/04/14  1545 History   First MD Initiated Contact with Patient 03/04/14 1627     Chief Complaint  Patient presents with  . Abdominal Pain     (Consider location/radiation/quality/duration/timing/severity/associated sxs/prior Treatment) HPI Comments: Patient is a 43 year old male with a past medical history of ulcerative colitis, diverticulitis and kidney stones who presents to the emergency department complaining of cramping lower abdominal pain and diarrhea x2 days. Patient reports he is only had a few episodes of nonbloody diarrhea, followed by a normal bowel movement today. He reports he has a cramping lower abdominal pain that is nonradiating that feels similar to his prior ulcerative colitis flares. States he had left over prednisone at home which he took, however he only had a few pills. States prednisone normally helps with his ulcerative colitis flares. Denies melena or hematochezia. Denies fever, chills, nausea or vomiting. No urinary symptoms. He has an appointment with his PCP in October. He was supposed to see him tomorrow, however the physician is on vacation. States he is starting to feel a little weak and tired. He reports he "overworked last week and believes that may be attributing to his symptoms". He does not have a gastroenterologist.  Patient is a 43 y.o. male presenting with abdominal pain. The history is provided by the patient.  Abdominal Pain Associated symptoms: diarrhea     Past Medical History  Diagnosis Date  . Ulcerative colitis   . Diverticulitis   . Kidney stone    Past Surgical History  Procedure Laterality Date  . Knee arthroscopy    . Hernia repair    . Back surgery     Family History  Problem Relation Age of Onset  . Cancer Neg Hx   . Diabetes Neg Hx   . Heart failure Neg Hx   . Ulcerative colitis Neg Hx   . Crohn's disease Neg Hx    History  Substance Use Topics  . Smoking status: Current Every Day  Smoker -- 0.50 packs/day for 12.5 years    Types: Cigarettes  . Smokeless tobacco: Never Used  . Alcohol Use: 0.0 oz/week     Comment: No ETOH in 2 weeks; Prior 2-3 beers every 2-3 weeks.    Review of Systems  Gastrointestinal: Positive for abdominal pain and diarrhea.  All other systems reviewed and are negative.     Allergies  Review of patient's allergies indicates no known allergies.  Home Medications   Prior to Admission medications   Medication Sig Start Date End Date Taking? Authorizing Provider  HYDROcodone-acetaminophen (NORCO/VICODIN) 5-325 MG per tablet Take 2 tablets by mouth every 6 (six) hours as needed for pain. 01/05/13   Roxy Horsemanobert Browning, PA-C  HYDROcodone-acetaminophen (NORCO/VICODIN) 5-325 MG per tablet Take 2 tablets by mouth every 4 (four) hours as needed. 03/11/13   Elson AreasLeslie K Sofia, PA-C  hyoscyamine (LEVSIN SL) 0.125 MG SL tablet Place 2 tablets (0.25 mg total) under the tongue every 4 (four) hours as needed for cramping. 10/15/12   Maryruth Bunhristina P Rama, MD  mesalamine (CANASA) 1000 MG suppository Place 1 suppository (1,000 mg total) rectally at bedtime as needed. Use as needed for UC flare. 10/15/12   Maryruth Bunhristina P Rama, MD  mesalamine (PENTASA) 250 MG CR capsule Take 4 capsules (1,000 mg total) by mouth 4 (four) times daily. 10/15/12   Maryruth Bunhristina P Rama, MD  Multiple Vitamin (MULTIVITAMIN) tablet Take 1 tablet by mouth daily.    Historical Provider, MD  predniSONE (DELTASONE) 20 MG tablet Take 2 tablets (40 mg total) by mouth daily. 01/05/13   Roxy Horseman, PA-C  sulfamethoxazole-trimethoprim (SEPTRA DS) 800-160 MG per tablet Take 1 tablet by mouth every 12 (twelve) hours. 03/11/13   Elson Areas, PA-C   BP 114/66  Pulse 51  Temp(Src) 99.1 F (37.3 C) (Oral)  Resp 18  Ht 5\' 8"  (1.727 m)  Wt 190 lb (86.183 kg)  BMI 28.90 kg/m2  SpO2 99% Physical Exam  Nursing note and vitals reviewed. Constitutional: He is oriented to person, place, and time. He appears  well-developed and well-nourished. No distress.  HENT:  Head: Normocephalic and atraumatic.  Mouth/Throat: Oropharynx is clear and moist.  Moist mucous membranes.  Eyes: Conjunctivae are normal.  Neck: Normal range of motion. Neck supple.  Cardiovascular: Normal rate, regular rhythm and normal heart sounds.   Pulmonary/Chest: Effort normal and breath sounds normal.  Abdominal: Soft. Bowel sounds are normal.  Mild tenderness to right lower quadrant and suprapubic area. No rebound, guarding, rigidity. No peritoneal signs. No tenderness at McBurney's point.  Musculoskeletal: Normal range of motion. He exhibits no edema.  Neurological: He is alert and oriented to person, place, and time.  Skin: Skin is warm and dry. He is not diaphoretic.  Psychiatric: He has a normal mood and affect. His behavior is normal.    ED Course  Procedures (including critical care time) Labs Review Labs Reviewed  COMPREHENSIVE METABOLIC PANEL - Abnormal; Notable for the following:    Glucose, Bld 126 (*)    GFR calc non Af Amer 90 (*)    All other components within normal limits  CBC  URINALYSIS, ROUTINE W REFLEX MICROSCOPIC    Imaging Review No results found.   EKG Interpretation None      MDM   Final diagnoses:  Diarrhea  Abdominal pain in male   Patient presenting with abdominal cramping and diarrhea. History of ulcerative colitis and diverticulitis. No bloody stool. He is nontoxic appearing and in no apparent distress. Vital signs stable. Mild abdominal tenderness on exam. Plan to check labs, urinalysis and give IV fluids. 6:43 PM Labs without any acute findings. Urinalysis negative. Patient received IV fluids. States he is currently not having any abdominal pain, just slight pressure. Given patient had a normal bowel movement today, doubt ulcerative colitis flare. Resources given for followup with gastroenterologist. I do not feel patient needs to be on prednisone at this time. Stable for  discharge. Return precautions given. Patient states understanding of treatment care plan and is agreeable.  Trevor Mace, PA-C 03/04/14 [phone]

## 2014-03-04 NOTE — ED Notes (Signed)
Pt reports lower abodominal pain and diarrhea since Sunday. Pt says this "feels like my colitis"

## 2014-03-04 NOTE — Discharge Instructions (Signed)
Abdominal Pain °Many things can cause abdominal pain. Usually, abdominal pain is not caused by a disease and will improve without treatment. It can often be observed and treated at home. Your health care provider will do a physical exam and possibly order blood tests and X-rays to help determine the seriousness of your pain. However, in many cases, more time must pass before a clear cause of the pain can be found. Before that point, your health care provider may not know if you need more testing or further treatment. °HOME CARE INSTRUCTIONS  °Monitor your abdominal pain for any changes. The following actions may help to alleviate any discomfort you are experiencing: °· Only take over-the-counter or prescription medicines as directed by your health care provider. °· Do not take laxatives unless directed to do so by your health care provider. °· Try a clear liquid diet (broth, tea, or water) as directed by your health care provider. Slowly move to a bland diet as tolerated. °SEEK MEDICAL CARE IF: °· You have unexplained abdominal pain. °· You have abdominal pain associated with nausea or diarrhea. °· You have pain when you urinate or have a bowel movement. °· You experience abdominal pain that wakes you in the night. °· You have abdominal pain that is worsened or improved by eating food. °· You have abdominal pain that is worsened with eating fatty foods. °· You have a fever. °SEEK IMMEDIATE MEDICAL CARE IF:  °· Your pain does not go away within 2 hours. °· You keep throwing up (vomiting). °· Your pain is felt only in portions of the abdomen, such as the right side or the left lower portion of the abdomen. °· You pass bloody or black tarry stools. °MAKE SURE YOU: °· Understand these instructions.   °· Will watch your condition.   °· Will get help right away if you are not doing well or get worse.   °Document Released: 04/13/2005 Document Revised: 07/09/2013 Document Reviewed: 03/13/2013 °[employer]® Patient Information  ©2015 [employer]. This information is not intended to replace advice given to you by your health care provider. Make sure you discuss any questions you have with your health care provider. ° °Diarrhea °Diarrhea is frequent loose and watery bowel movements. It can cause you to feel weak and dehydrated. Dehydration can cause you to become tired and thirsty, have a dry mouth, and have decreased urination that often is dark yellow. Diarrhea is a sign of another problem, most often an infection that will not last long. In most cases, diarrhea typically lasts 2-3 days. However, it can last longer if it is a sign of something more serious. It is important to treat your diarrhea as directed by your caregiver to lessen or prevent future episodes of diarrhea. °CAUSES  °Some common causes include: °· Gastrointestinal infections caused by viruses, bacteria, or parasites. °· Food poisoning or food allergies. °· Certain medicines, such as antibiotics, chemotherapy, and laxatives. °· Artificial sweeteners and fructose. °· Digestive disorders. °HOME CARE INSTRUCTIONS °· Ensure adequate fluid intake (hydration): Have 1 cup (8 oz) of fluid for each diarrhea episode. Avoid fluids that contain simple sugars or sports drinks, fruit juices, whole milk products, and sodas. Your urine should be clear or pale yellow if you are drinking enough fluids. Hydrate with an oral rehydration solution that you can purchase at pharmacies, retail stores, and online. You can prepare an oral rehydration solution at home by mixing the following ingredients together: °¨  - tsp table salt. °¨ ¾ tsp baking soda. °¨    tsp salt substitute containing potassium chloride. °¨ 1  tablespoons sugar. °¨ 1 L (34 oz) of water. °· Certain foods and beverages may increase the speed at which food moves through the gastrointestinal (GI) tract. These foods and beverages should be avoided and include: °¨ Caffeinated and alcoholic beverages. °¨ High-fiber foods, such as raw  fruits and vegetables, nuts, seeds, and whole grain breads and cereals. °¨ Foods and beverages sweetened with sugar alcohols, such as xylitol, sorbitol, and mannitol. °· Some foods may be well tolerated and may help thicken stool including: °¨ Starchy foods, such as rice, toast, pasta, low-sugar cereal, oatmeal, grits, baked potatoes, crackers, and bagels. °¨ Bananas. °¨ Applesauce. °· Add probiotic-rich foods to help increase healthy bacteria in the GI tract, such as yogurt and fermented milk products. °· Wash your hands well after each diarrhea episode. °· Only take over-the-counter or prescription medicines as directed by your caregiver. °· Take a warm bath to relieve any burning or pain from frequent diarrhea episodes. °SEEK IMMEDIATE MEDICAL CARE IF:  °· You are unable to keep fluids down. °· You have persistent vomiting. °· You have blood in your stool, or your stools are black and tarry. °· You do not urinate in 6-8 hours, or there is only a small amount of very dark urine. °· You have abdominal pain that increases or localizes. °· You have weakness, dizziness, confusion, or light-headedness. °· You have a severe headache. °· Your diarrhea gets worse or does not get better. °· You have a fever or persistent symptoms for more than 2-3 days. °· You have a fever and your symptoms suddenly get worse. °MAKE SURE YOU:  °· Understand these instructions. °· Will watch your condition. °· Will get help right away if you are not doing well or get worse. °Document Released: 06/24/2002 Document Revised: 11/18/2013 Document Reviewed: 03/11/2012 °[employer]® Patient Information ©2015 [employer]. This information is not intended to replace advice given to you by your health care provider. Make sure you discuss any questions you have with your health care provider. ° °

## 2014-03-05 NOTE — ED Provider Notes (Signed)
Medical screening examination/treatment/procedure(s) were performed by non-physician practitioner and as supervising physician I was immediately available for consultation/collaboration.   EKG Interpretation None        Gerome Kokesh, MD 03/05/14 1121 

## 2014-04-28 ENCOUNTER — Other Ambulatory Visit: Payer: Self-pay | Admitting: Internal Medicine

## 2014-04-28 DIAGNOSIS — N5089 Other specified disorders of the male genital organs: Secondary | ICD-10-CM

## 2014-05-13 ENCOUNTER — Encounter: Payer: Self-pay | Admitting: Internal Medicine

## 2014-05-28 ENCOUNTER — Other Ambulatory Visit: Payer: PRIVATE HEALTH INSURANCE

## 2014-09-26 ENCOUNTER — Other Ambulatory Visit: Payer: Self-pay | Admitting: Emergency Medicine

## 2014-09-26 ENCOUNTER — Encounter (HOSPITAL_BASED_OUTPATIENT_CLINIC_OR_DEPARTMENT_OTHER): Payer: Self-pay | Admitting: *Deleted

## 2014-09-26 ENCOUNTER — Emergency Department (HOSPITAL_BASED_OUTPATIENT_CLINIC_OR_DEPARTMENT_OTHER)
Admission: EM | Admit: 2014-09-26 | Discharge: 2014-09-26 | Disposition: A | Discharge status: Home / Self Care | Payer: PRIVATE HEALTH INSURANCE | Attending: Emergency Medicine | Admitting: Emergency Medicine

## 2014-09-26 DIAGNOSIS — J069 Acute upper respiratory infection, unspecified: Secondary | ICD-10-CM | POA: Insufficient documentation

## 2014-09-26 DIAGNOSIS — Z8719 Personal history of other diseases of the digestive system: Secondary | ICD-10-CM | POA: Insufficient documentation

## 2014-09-26 DIAGNOSIS — R5383 Other fatigue: Secondary | ICD-10-CM | POA: Insufficient documentation

## 2014-09-26 DIAGNOSIS — Z792 Long term (current) use of antibiotics: Secondary | ICD-10-CM | POA: Diagnosis not present

## 2014-09-26 DIAGNOSIS — Z87442 Personal history of urinary calculi: Secondary | ICD-10-CM | POA: Insufficient documentation

## 2014-09-26 DIAGNOSIS — R197 Diarrhea, unspecified: Secondary | ICD-10-CM | POA: Insufficient documentation

## 2014-09-26 DIAGNOSIS — Z79899 Other long term (current) drug therapy: Secondary | ICD-10-CM | POA: Diagnosis not present

## 2014-09-26 DIAGNOSIS — Z7951 Long term (current) use of inhaled steroids: Secondary | ICD-10-CM | POA: Diagnosis not present

## 2014-09-26 DIAGNOSIS — R51 Headache: Secondary | ICD-10-CM | POA: Insufficient documentation

## 2014-09-26 DIAGNOSIS — Z720 Tobacco use: Secondary | ICD-10-CM | POA: Diagnosis not present

## 2014-09-26 DIAGNOSIS — R509 Fever, unspecified: Secondary | ICD-10-CM | POA: Diagnosis present

## 2014-09-26 LAB — RAPID STREP SCREEN (MED CTR MEBANE ONLY): Streptococcus, Group A Screen (Direct): NEGATIVE

## 2014-09-26 NOTE — ED Notes (Signed)
Fever, sore throat. No energy.  Hx of colitis. He is having a flare up. Started Prednisone on his own yesterday. States he knows how much to take due to long term hx of UC.

## 2014-09-26 NOTE — ED Provider Notes (Signed)
CSN: [id number]     Arrival date & time 09/26/14  1104 History   First MD Initiated Contact with Patient 09/26/14 1504     Chief Complaint  Patient presents with  . Fever     (Consider location/radiation/quality/duration/timing/severity/associated sxs/prior Treatment) Patient is a 44 y.o. male presenting with URI. The history is provided by the patient.  URI Presenting symptoms: congestion, cough, fatigue, fever, rhinorrhea and sore throat   Cough:    Cough characteristics:  Non-productive   Severity:  Mild   Timing:  Intermittent   Progression:  Unchanged Fever:    Timing:  Intermittent   Temp source:  Subjective   Progression:  Resolved Sore throat:    Severity:  Moderate   Onset quality:  Gradual   Duration:  2 days   Timing:  Constant   Progression:  Worsening Severity:  Moderate Onset quality:  Gradual Duration:  5 days Timing:  Constant Progression:  Unchanged Chronicity:  New Relieved by:  None tried Worsened by:  Nothing tried Ineffective treatments:  None tried Associated symptoms: headaches   Associated symptoms: no arthralgias, no myalgias, no neck pain, no sinus pain, no swollen glands and no wheezing   Risk factors: sick contacts   Risk factors: no immunosuppression and no recent travel   Risk factors comment:  Hx of UC on intermittent prednisone   Past Medical History  Diagnosis Date  . Ulcerative colitis   . Diverticulitis   . Kidney stone    Past Surgical History  Procedure Laterality Date  . Knee arthroscopy    . Hernia repair    . Back surgery     Family History  Problem Relation Age of Onset  . Cancer Neg Hx   . Diabetes Neg Hx   . Heart failure Neg Hx   . Ulcerative colitis Neg Hx   . Crohn's disease Neg Hx    History  Substance Use Topics  . Smoking status: Current Every Day Smoker -- 0.50 packs/day for 12.5 years    Types: Cigarettes  . Smokeless tobacco: Never Used  . Alcohol Use: 0.0 oz/week     Comment: No ETOH in 2 weeks;  Prior 2-3 beers every 2-3 weeks.    Review of Systems  Constitutional: Positive for fever and fatigue.  HENT: Positive for congestion, rhinorrhea and sore throat.   Respiratory: Positive for cough. Negative for wheezing.   Gastrointestinal: Positive for diarrhea. Negative for abdominal pain and blood in stool.  Musculoskeletal: Negative for myalgias, arthralgias and neck pain.  Neurological: Positive for headaches.      Allergies  Review of patient's allergies indicates no known allergies.  Home Medications   Prior to Admission medications   Medication Sig Start Date End Date Taking? Authorizing Provider  HYDROcodone-acetaminophen (NORCO/VICODIN) 5-325 MG per tablet Take 2 tablets by mouth every 6 (six) hours as needed for pain. 01/05/13   Roxy Horsemanobert Browning, PA-C  HYDROcodone-acetaminophen (NORCO/VICODIN) 5-325 MG per tablet Take 2 tablets by mouth every 4 (four) hours as needed. 03/11/13   Elson AreasLeslie K Sofia, PA-C  hyoscyamine (LEVSIN SL) 0.125 MG SL tablet Place 2 tablets (0.25 mg total) under the tongue every 4 (four) hours as needed for cramping. 10/15/12   Maryruth Bunhristina P Rama, MD  mesalamine (CANASA) 1000 MG suppository Place 1 suppository (1,000 mg total) rectally at bedtime as needed. Use as needed for UC flare. 10/15/12   Maryruth Bunhristina P Rama, MD  mesalamine (PENTASA) 250 MG CR capsule Take 4 capsules (1,000 mg total) by  mouth 4 (four) times daily. 10/15/12   Maryruth Bun Rama, MD  Multiple Vitamin (MULTIVITAMIN) tablet Take 1 tablet by mouth daily.    Historical Provider, MD  predniSONE (DELTASONE) 20 MG tablet Take 2 tablets (40 mg total) by mouth daily. 01/05/13   Roxy Horseman, PA-C  sulfamethoxazole-trimethoprim (SEPTRA DS) 800-160 MG per tablet Take 1 tablet by mouth every 12 (twelve) hours. 03/11/13   Elson Areas, PA-C   BP 126/80 mmHg  Pulse 82  Temp(Src) 98.1 F (36.7 C) (Oral)  Resp 16  Ht  (1.727 m)  Wt 190 lb (86.183 kg)  BMI 28.90 kg/m2  SpO2 100% Physical Exam   Constitutional: He is oriented to person, place, and time. He appears well-developed and well-nourished. No distress.  HENT:  Head: Normocephalic and atraumatic.  Nose: Mucosal edema and rhinorrhea present.  Mouth/Throat: Mucous membranes are normal. Posterior oropharyngeal erythema present. No oropharyngeal exudate or posterior oropharyngeal edema.  Eyes: Conjunctivae and EOM are normal. Pupils are equal, round, and reactive to light.  Neck: Normal range of motion. Neck supple. No spinous process tenderness and no muscular tenderness present. No Brudzinski's sign and no Kernig's sign noted.  Cardiovascular: Normal rate, regular rhythm and intact distal pulses.   No murmur heard. Pulmonary/Chest: Effort normal and breath sounds normal. No respiratory distress. He has no wheezes. He has no rales.  Abdominal: Soft. He exhibits no distension. There is no tenderness. There is no rebound and no guarding.  Musculoskeletal: Normal range of motion. He exhibits no edema or tenderness.  Neurological: He is alert and oriented to person, place, and time.  Skin: Skin is warm and dry. No rash noted. No erythema.  Psychiatric: He has a normal mood and affect. His behavior is normal.  Nursing note and vitals reviewed.   ED Course  Procedures (including critical care time) Labs Review Labs Reviewed  RAPID STREP SCREEN  CULTURE, GROUP A STREP    Imaging Review No results found.   EKG Interpretation None      MDM   Final diagnoses:  Acute URI    Pt with symptoms consistent with viral URI.  Well appearing here.  No signs of breathing difficulty  No signs of otitis or abnormal abdominal findings.  Erythema of the posterior pharynx but no exudates or unilateral swelling concerning for PTA, RPA or strep pharyngitis. Rapid strep was negative.  Patient had a flu shot this year.  Feel most likely patient has a viral illness. He is to rest continue fluids and return for worsening  symptoms     Gwyneth Sprout, MD 09/26/14 1649

## 2014-09-26 NOTE — Discharge Instructions (Signed)
Upper Respiratory Infection, Adult An upper respiratory infection (URI) is also sometimes known as the common cold. The upper respiratory tract includes the nose, sinuses, throat, trachea, and bronchi. Bronchi are the airways leading to the lungs. Most people improve within 1 week, but symptoms can last up to 2 weeks. A residual cough may last even longer.  CAUSES Many different viruses can infect the tissues lining the upper respiratory tract. The tissues become irritated and inflamed and often become very moist. Mucus production is also common. A cold is contagious. You can easily spread the virus to others by oral contact. This includes kissing, sharing a glass, coughing, or sneezing. Touching your mouth or nose and then touching a surface, which is then touched by another person, can also spread the virus. SYMPTOMS  Symptoms typically develop 1 to 3 days after you come in contact with a cold virus. Symptoms vary from person to person. They may include:  Runny nose.  Sneezing.  Nasal congestion.  Sinus irritation.  Sore throat.  Loss of voice (laryngitis).  Cough.  Fatigue.  Muscle aches.  Loss of appetite.  Headache.  Low-grade fever. DIAGNOSIS  You might diagnose your own cold based on familiar symptoms, since most people get a cold 2 to 3 times a year. Your caregiver can confirm this based on your exam. Most importantly, your caregiver can check that your symptoms are not due to another disease such as strep throat, sinusitis, pneumonia, asthma, or epiglottitis. Blood tests, throat tests, and X-rays are not necessary to diagnose a common cold, but they may sometimes be helpful in excluding other more serious diseases. Your caregiver will decide if any further tests are required. RISKS AND COMPLICATIONS  You may be at risk for a more severe case of the common cold if you smoke cigarettes, have chronic heart disease (such as heart failure) or lung disease (such as asthma), or if  you have a weakened immune system. The very young and very old are also at risk for more serious infections. Bacterial sinusitis, middle ear infections, and bacterial pneumonia can complicate the common cold. The common cold can worsen asthma and chronic obstructive pulmonary disease (COPD). Sometimes, these complications can require emergency medical care and may be life-threatening. PREVENTION  The best way to protect against getting a cold is to practice good hygiene. Avoid oral or hand contact with people with cold symptoms. Wash your hands often if contact occurs. There is no clear evidence that vitamin C, vitamin E, echinacea, or exercise reduces the chance of developing a cold. However, it is always recommended to get plenty of rest and practice good nutrition. TREATMENT  Treatment is directed at relieving symptoms. There is no cure. Antibiotics are not effective, because the infection is caused by a virus, not by bacteria. Treatment may include:  Increased fluid intake. Sports drinks offer valuable electrolytes, sugars, and fluids.  Breathing heated mist or steam (vaporizer or shower).  Eating chicken soup or other clear broths, and maintaining good nutrition.  Getting plenty of rest.  Using gargles or lozenges for comfort.  Controlling fevers with ibuprofen or acetaminophen as directed by your caregiver.  Increasing usage of your inhaler if you have asthma. Zinc gel and zinc lozenges, taken in the first 24 hours of the common cold, can shorten the duration and lessen the severity of symptoms. Pain medicines may help with fever, muscle aches, and throat pain. A variety of non-prescription medicines are available to treat congestion and runny nose. Your caregiver   can make recommendations and may suggest nasal or lung inhalers for other symptoms.  HOME CARE INSTRUCTIONS   Only take over-the-counter or prescription medicines for pain, discomfort, or fever as directed by your  caregiver.  Use a warm mist humidifier or inhale steam from a shower to increase air moisture. This may keep secretions moist and make it easier to breathe.  Drink enough water and fluids to keep your urine clear or pale yellow.  Rest as needed.  Return to work when your temperature has returned to normal or as your caregiver advises. You may need to stay home longer to avoid infecting others. You can also use a face mask and careful hand washing to prevent spread of the virus. SEEK MEDICAL CARE IF:   After the first few days, you feel you are getting worse rather than better.  You need your caregiver's advice about medicines to control symptoms.  You develop chills, worsening shortness of breath, or brown or red sputum. These may be signs of pneumonia.  You develop yellow or brown nasal discharge or pain in the face, especially when you bend forward. These may be signs of sinusitis.  You develop a fever, swollen neck glands, pain with swallowing, or white areas in the back of your throat. These may be signs of strep throat. SEEK IMMEDIATE MEDICAL CARE IF:   You have a fever.  You develop severe or persistent headache, ear pain, sinus pain, or chest pain.  You develop wheezing, a prolonged cough, cough up blood, or have a change in your usual mucus (if you have chronic lung disease).  You develop sore muscles or a stiff neck. Document Released: 12/28/2000 Document Revised: 09/26/2011 Document Reviewed: 10/09/2013 [employer] Patient Information 2015 [employer]. This information is not intended to replace advice given to you by your health care provider. Make sure you discuss any questions you have with your health care provider.  

## 2014-09-28 LAB — CULTURE, GROUP A STREP: Strep A Culture: NEGATIVE

## 2015-01-03 IMAGING — CT CT ABD-PELV W/ CM
1 of 3 series · 14 of 32 positions shown, 19 images · IV contrast (OMNIPAQUE 300)
Comparison: 06/24/2010.

CLINICAL DATA: Abdominal pain.  History of colitis and
diverticulitis with rectal bleeding.

CT ABDOMEN AND PELVIS WITH CONTRAST
TECHNIQUE: Multidetector CT imaging of the abdomen and pelvis was
performed following the standard protocol during bolus
administration of intravenous contrast.
Contrast: 100mL OMNIPAQUE IOHEXOL 300 MG/ML  SOLN

[Series 2: abd/pel with · axial · 0.74mm/px · z∈[-501,-76]mm · 14 of 95 slices shown, 19 images]
[im 5/95  soft-tissue]
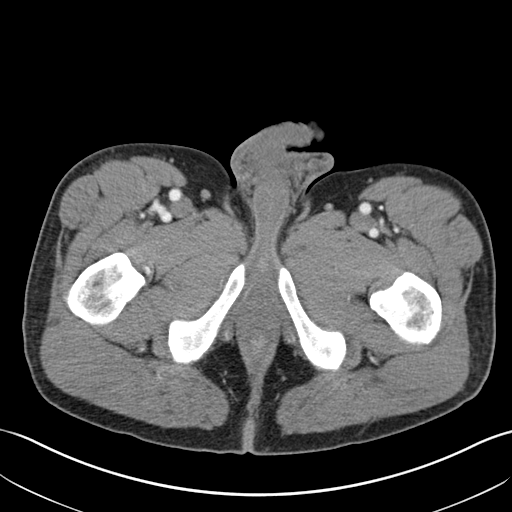
[im 5/95  bone]
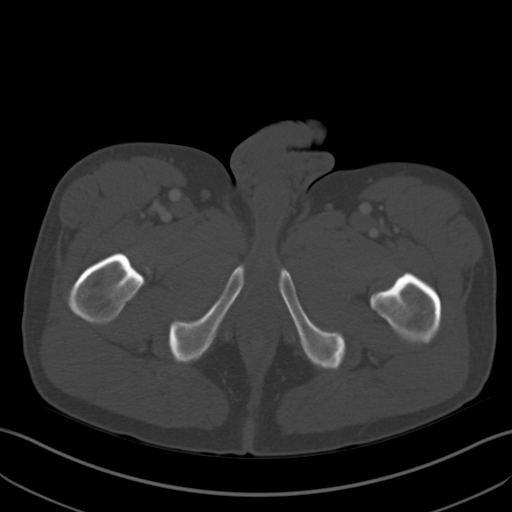
[im 15/95  soft-tissue]
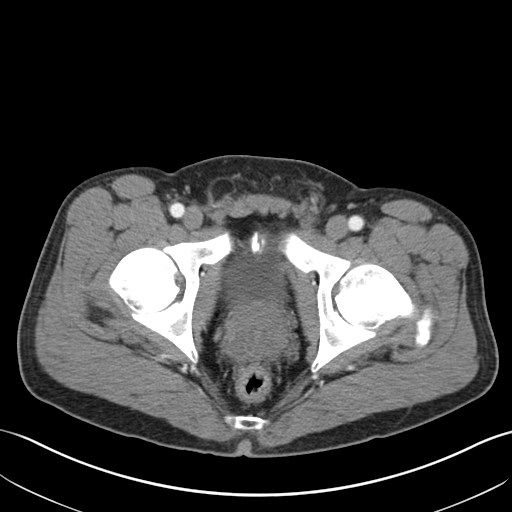
[im 20/95  soft-tissue]
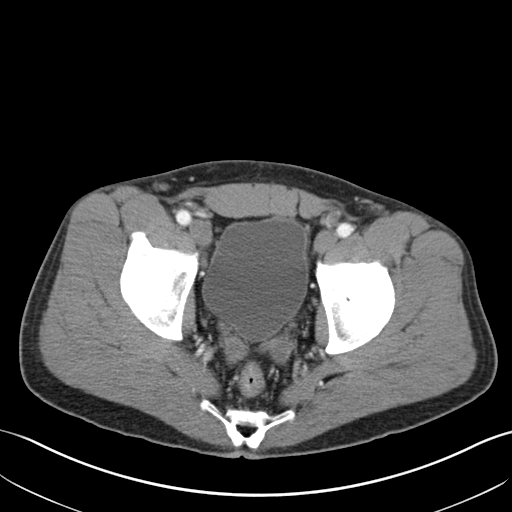
[im 25/95  soft-tissue]
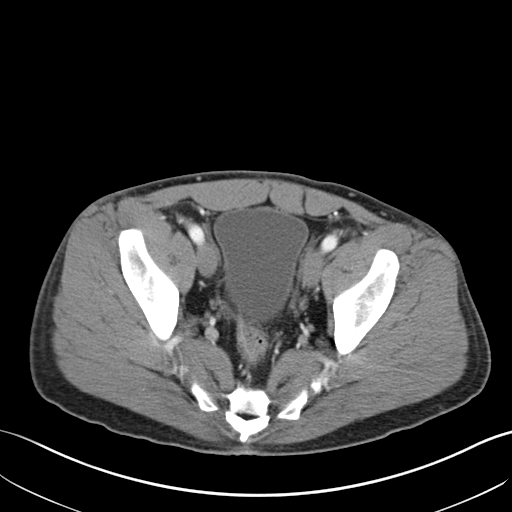
[im 35/95  soft-tissue]
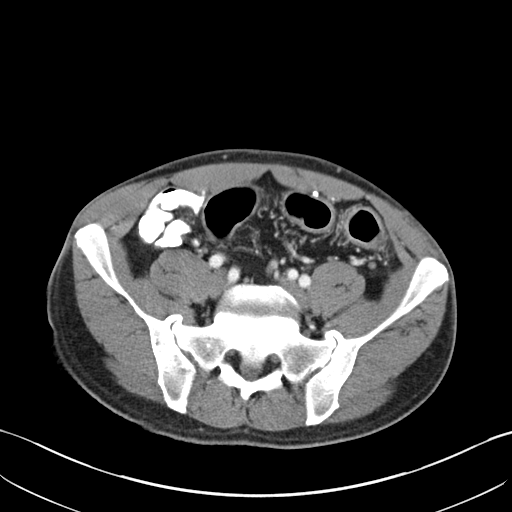
[im 40/95  soft-tissue]
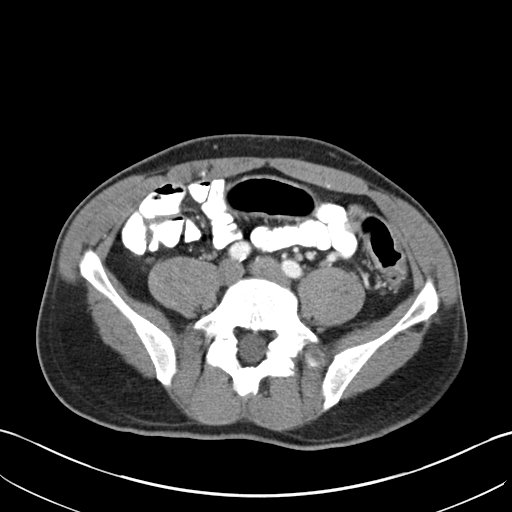
[im 50/95  soft-tissue]
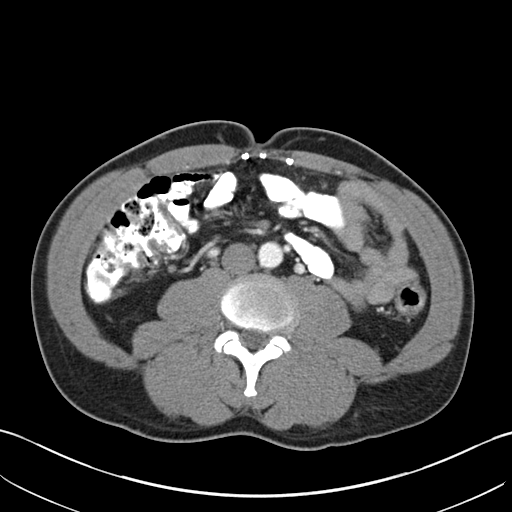
[im 55/95  soft-tissue]
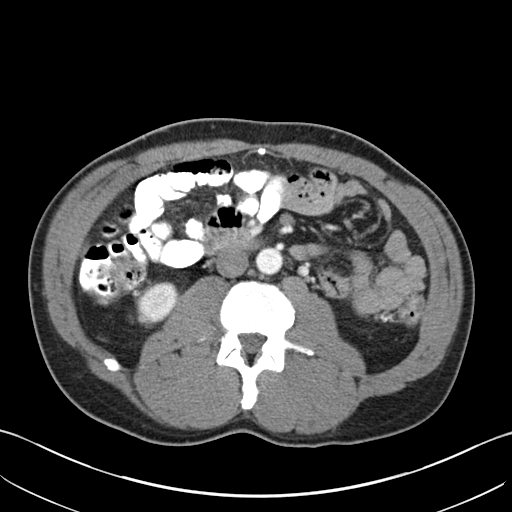
[im 60/95  soft-tissue]
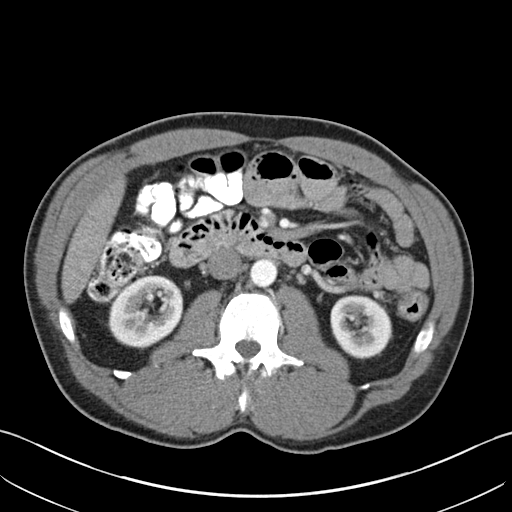
[im 60/95  bone]
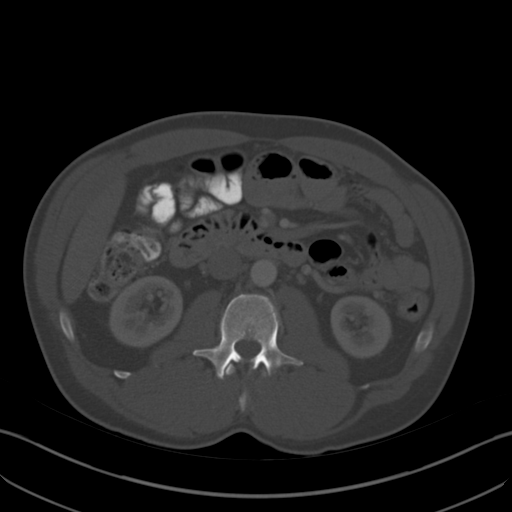
[im 70/95  soft-tissue]
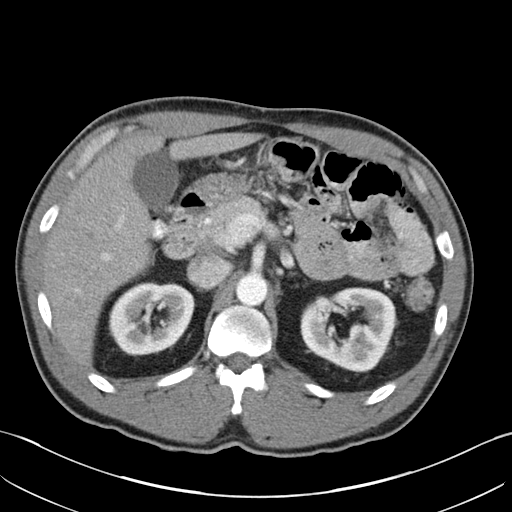
[im 75/95  soft-tissue]
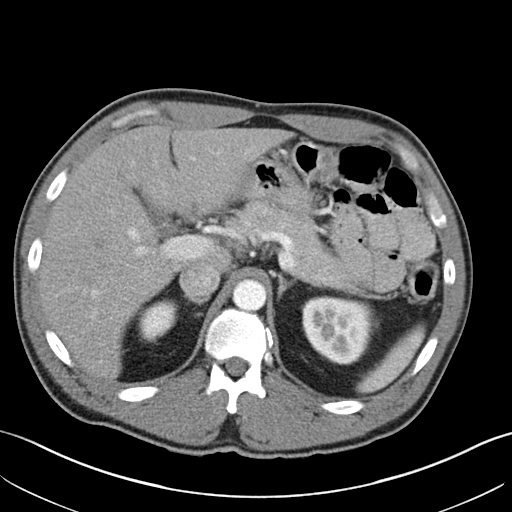
[im 75/95  lung]
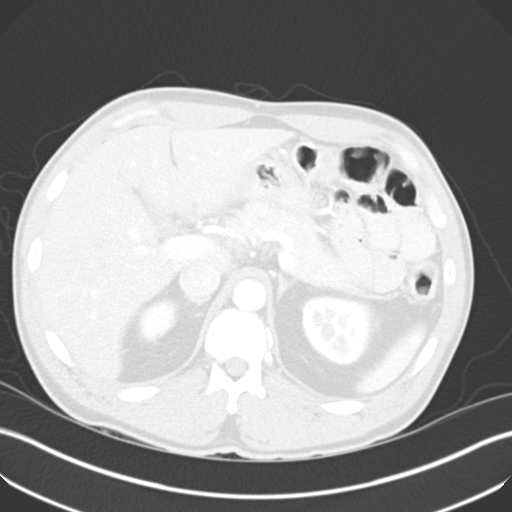
[im 80/95  soft-tissue]
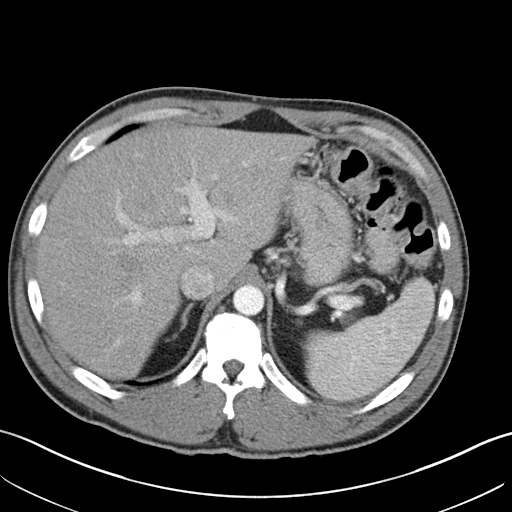
[im 80/95  lung]
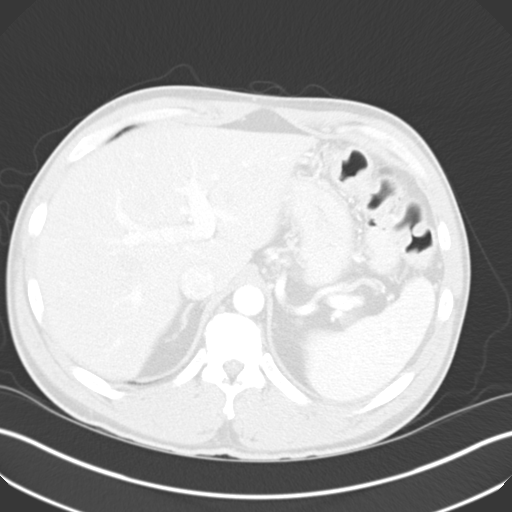
[im 85/95  lung]
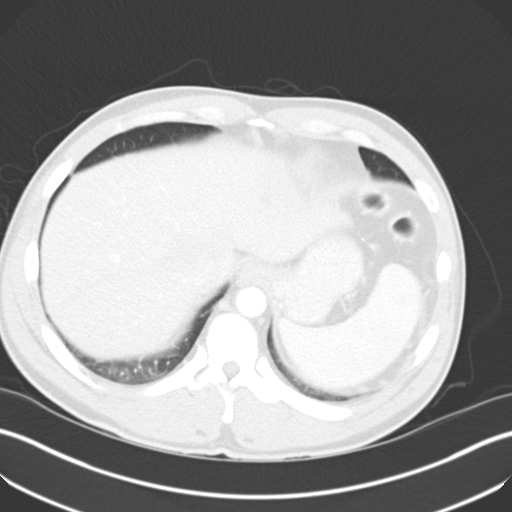
[im 90/95  soft-tissue]
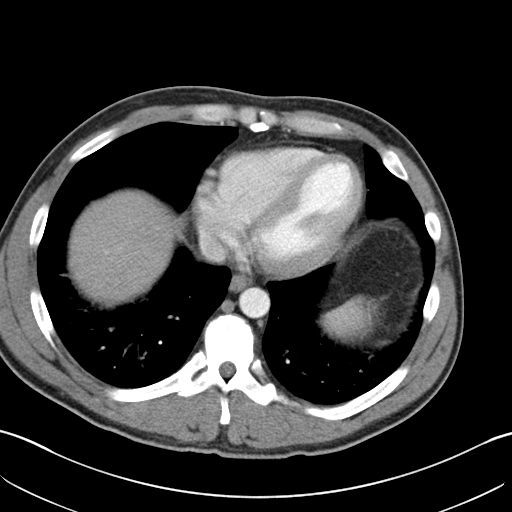
[im 90/95  lung]
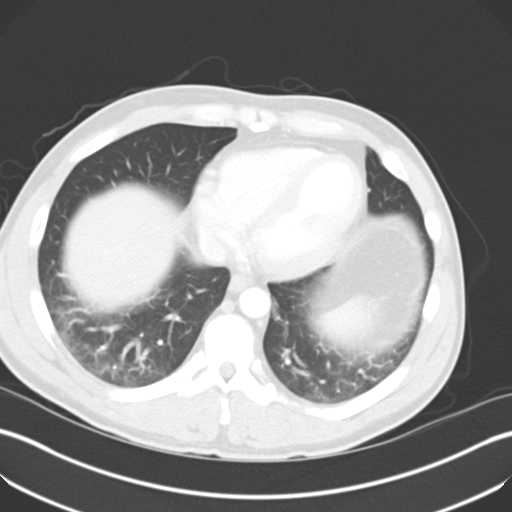

[14 of 32 positions shown; findings below may reference images not displayed]

FINDINGS: Mild diffuse low density of the liver relative to the
spleen.  Mild diffuse low density wall thickening involving the
distal transverse colon, splenic flexure, descending colon, sigmoid
colon and rectum.  Minimal associated pericolonic soft tissue
stranding.  No fluid collections or enlarged lymph nodes.

Moderately enlarged prostate gland.  Small right inguinal hernia
containing fat.

Normal appearing spleen, pancreas, gallbladder, adrenal glands,
kidneys and urinary bladder.  The bilateral sacroiliac joint
degenerative changes.  Mild lumbar and lower thoracic spine
degenerative changes.
IMPRESSION: 1.  Mild colitis involving the distal transverse colon, splenic
flexure, descending colon, sigmoid colon and rectum, compatible
with ulcerative colitis.
2.  Mild diffuse hepatic steatosis.

## 2015-09-15 ENCOUNTER — Encounter (HOSPITAL_COMMUNITY): Payer: Self-pay | Admitting: Emergency Medicine

## 2015-09-15 DIAGNOSIS — R319 Hematuria, unspecified: Secondary | ICD-10-CM | POA: Insufficient documentation

## 2015-09-15 DIAGNOSIS — F17210 Nicotine dependence, cigarettes, uncomplicated: Secondary | ICD-10-CM | POA: Insufficient documentation

## 2015-09-15 DIAGNOSIS — R300 Dysuria: Secondary | ICD-10-CM | POA: Insufficient documentation

## 2015-09-15 DIAGNOSIS — Z7952 Long term (current) use of systemic steroids: Secondary | ICD-10-CM | POA: Insufficient documentation

## 2015-09-15 DIAGNOSIS — R61 Generalized hyperhidrosis: Secondary | ICD-10-CM | POA: Insufficient documentation

## 2015-09-15 DIAGNOSIS — Z79899 Other long term (current) drug therapy: Secondary | ICD-10-CM | POA: Insufficient documentation

## 2015-09-15 DIAGNOSIS — K5190 Ulcerative colitis, unspecified, without complications: Secondary | ICD-10-CM | POA: Insufficient documentation

## 2015-09-15 DIAGNOSIS — R1031 Right lower quadrant pain: Secondary | ICD-10-CM | POA: Insufficient documentation

## 2015-09-15 DIAGNOSIS — Z87442 Personal history of urinary calculi: Secondary | ICD-10-CM | POA: Insufficient documentation

## 2015-09-15 LAB — URINALYSIS, ROUTINE W REFLEX MICROSCOPIC
GLUCOSE, UA: NEGATIVE mg/dL
KETONES UR: 40 mg/dL — AB
LEUKOCYTES UA: NEGATIVE
NITRITE: NEGATIVE
PH: 5.5 (ref 5.0–8.0)
Protein, ur: NEGATIVE mg/dL
SPECIFIC GRAVITY, URINE: 1.026 (ref 1.005–1.030)

## 2015-09-15 LAB — BASIC METABOLIC PANEL
ANION GAP: 12 (ref 5–15)
BUN: 15 mg/dL (ref 6–20)
CHLORIDE: 105 mmol/L (ref 101–111)
CO2: 23 mmol/L (ref 22–32)
Calcium: 9.3 mg/dL (ref 8.9–10.3)
Creatinine, Ser: 1.35 mg/dL — ABNORMAL HIGH (ref 0.61–1.24)
GFR calc non Af Amer: >60 mL/min (ref >60)
GLUCOSE: 111 mg/dL — AB (ref 65–99)
Potassium: 3.8 mmol/L (ref 3.5–5.1)
Sodium: 140 mmol/L (ref 135–145)

## 2015-09-15 LAB — CBC
HEMATOCRIT: 47 % (ref 39.0–52.0)
HEMOGLOBIN: 16.3 g/dL (ref 13.0–17.0)
MCH: 30.2 pg (ref 26.0–34.0)
MCHC: 34.7 g/dL (ref 30.0–36.0)
MCV: 87 fL (ref 78.0–100.0)
Platelets: 265 10*3/uL (ref 150–400)
RBC: 5.4 MIL/uL (ref 4.22–5.81)
RDW: 12.4 % (ref 11.5–15.5)
WBC: 10.5 10*3/uL (ref 4.0–10.5)

## 2015-09-15 LAB — URINE MICROSCOPIC-ADD ON

## 2015-09-15 NOTE — ED Notes (Signed)
Pt reports around 8pm today he had a sudden onset on right sided groin pain. Pt states has hx of inguinal hernia and also kidney stones.

## 2015-09-16 ENCOUNTER — Emergency Department (HOSPITAL_COMMUNITY)
Admission: EM | Admit: 2015-09-16 | Discharge: 2015-09-16 | Disposition: A | Discharge status: Home / Self Care | Payer: PRIVATE HEALTH INSURANCE | Attending: Emergency Medicine | Admitting: Emergency Medicine

## 2015-09-16 DIAGNOSIS — R1031 Right lower quadrant pain: Secondary | ICD-10-CM

## 2015-09-16 DIAGNOSIS — R319 Hematuria, unspecified: Secondary | ICD-10-CM

## 2015-09-16 NOTE — ED Provider Notes (Signed)
CSN: [id number]     Arrival date & time 09/15/15  2203 History  By signing my name below, I, Iona Beard, attest that this documentation has been prepared under the direction and in the presence of No att. providers found.   Electronically Signed: Iona Beard, ED Scribe. 09/16/2015. 2:23 AM     Chief Complaint  Patient presents with  . Groin Pain    Patient is a 45 y.o. male presenting with groin pain. The history is provided by the patient. No language interpreter was used.  Groin Pain This is a new problem. The current episode started 6 to 12 hours ago. The problem occurs constantly. The problem has been gradually improving. Pertinent negatives include no abdominal pain. He has tried nothing for the symptoms.   HPI Comments: Connor Mcpherson is a 45 y.o. male with PMSx of hernia surgery on both sides who presents to the Emergency Department complaining of sudden onset, constant, right-sided groin pain, onset about six hours ago. Pt states associated dysuria and diaphoresis. No worsening or alleviating factors noted. Pt denies testicle pain or swelling, penile discharge, cough, abdominal pain, or any other pertinent symptoms. He says that his pain alleviated while waiting in the ED.    Past Medical History  Diagnosis Date  . Ulcerative colitis   . Diverticulitis   . Kidney stone    Past Surgical History  Procedure Laterality Date  . Knee arthroscopy    . Hernia repair    . Back surgery     Family History  Problem Relation Age of Onset  . Cancer Neg Hx   . Diabetes Neg Hx   . Heart failure Neg Hx   . Ulcerative colitis Neg Hx   . Crohn's disease Neg Hx    Social History  Substance Use Topics  . Smoking status: Current Every Day Smoker -- 0.50 packs/day for 12.5 years    Types: Cigarettes  . Smokeless tobacco: Never Used  . Alcohol Use: 0.0 oz/week     Comment: No ETOH in 2 weeks; Prior 2-3 beers every 2-3 weeks.    Review of Systems  Constitutional: Positive  for diaphoresis.  Respiratory: Negative for cough.   Gastrointestinal: Negative for abdominal pain.  Genitourinary: Positive for dysuria. Negative for discharge, scrotal swelling and testicular pain.  Musculoskeletal:       Right sided groin pain.   All other systems reviewed and are negative.   Allergies  Review of patient's allergies indicates no known allergies.  Home Medications   Prior to Admission medications   Medication Sig Start Date End Date Taking? Authorizing Provider  HYDROcodone-acetaminophen (NORCO/VICODIN) 5-325 MG per tablet Take 2 tablets by mouth every 6 (six) hours as needed for pain. 01/05/13   Roxy Horseman, PA-C  HYDROcodone-acetaminophen (NORCO/VICODIN) 5-325 MG per tablet Take 2 tablets by mouth every 4 (four) hours as needed. 03/11/13   Elson Areas, PA-C  hyoscyamine (LEVSIN SL) 0.125 MG SL tablet Place 2 tablets (0.25 mg total) under the tongue every 4 (four) hours as needed for cramping. 10/15/12   Maryruth Bun Rama, MD  mesalamine (CANASA) 1000 MG suppository Place 1 suppository (1,000 mg total) rectally at bedtime as needed. Use as needed for UC flare. 10/15/12   Maryruth Bun Rama, MD  mesalamine (PENTASA) 250 MG CR capsule Take 4 capsules (1,000 mg total) by mouth 4 (four) times daily. 10/15/12   Maryruth Bun Rama, MD  Multiple Vitamin (MULTIVITAMIN) tablet Take 1 tablet by mouth daily.  Historical Provider, MD  predniSONE (DELTASONE) 20 MG tablet Take 2 tablets (40 mg total) by mouth daily. 01/05/13   Roxy Horseman, PA-C  sulfamethoxazole-trimethoprim (SEPTRA DS) 800-160 MG per tablet Take 1 tablet by mouth every 12 (twelve) hours. 03/11/13   Elson Areas, PA-C   BP 127/96 mmHg  Pulse 93  Temp(Src) 97.8 F (36.6 C) (Oral)  Resp 16  SpO2 99% Physical Exam CONSTITUTIONAL: Well developed/well nourished HEAD: Normocephalic/atraumatic EYES: EOMI/PERRL ENMT: Mucous membranes moist NECK: supple no meningeal signs SPINE/BACK:entire spine nontender CV:  S1/S2 noted, no murmurs/rubs/gallops noted LUNGS: Lungs are clear to auscultation bilaterally, no apparent distress ABDOMEN: soft, nontender, no rebound or guarding, bowel sounds noted throughout abdomen GU:No cva tenderness. No hernia. No scrotal TTP. No penile discharge. Chaperone present.  NEURO: Pt is awake/alert/appropriate, moves all extremitiesx4.  No facial droop.   EXTREMITIES: pulses normal/equal, full ROM SKIN: warm, color normal PSYCH: no abnormalities of mood noted, alert and oriented to situation  ED Course  Procedures  DIAGNOSTIC STUDIES: Oxygen Saturation is 99% on RA, normal by my interpretation.    COORDINATION OF CARE: 2:22 AM-Discussed treatment plan which includes urinalysis, CMP, and CBC with pt at bedside and pt agreed to plan.   No pain at this time Suspect likely ureteral stone that resolved Will d/c home  Labs Review Labs Reviewed  URINALYSIS, ROUTINE W REFLEX MICROSCOPIC (NOT AT [facility]) - Abnormal; Notable for the following:    Color, Urine AMBER (*)    APPearance CLOUDY (*)    Hgb urine dipstick LARGE (*)    Bilirubin Urine SMALL (*)    Ketones, ur 40 (*)    All other components within normal limits  BASIC METABOLIC PANEL - Abnormal; Notable for the following:    Glucose, Bld 111 (*)    Creatinine, Ser 1.35 (*)    All other components within normal limits  URINE MICROSCOPIC-ADD ON - Abnormal; Notable for the following:    Squamous Epithelial / LPF 0-5 (*)    Bacteria, UA RARE (*)    All other components within normal limits  CBC   I have personally reviewed and evaluated these lab results as part of my medical decision-making.   MDM   Final diagnoses:  Groin pain, right  Hematuria    Nursing notes including past medical history and social history reviewed and considered in documentation Labs/vital reviewed myself and considered during evaluation   I personally performed the services described in this documentation, which was scribed in my  presence. The recorded information has been reviewed and is accurate.      Zadie Rhine, MD 09/16/15 0330

## 2022-04-19 ENCOUNTER — Other Ambulatory Visit (HOSPITAL_COMMUNITY): Payer: Self-pay

## 2022-04-19 MED ORDER — PREDNISONE 10 MG PO TABS
40.0000 mg | ORAL_TABLET | Freq: Every day | ORAL | 3 refills | Status: AC
  Filled 2022-04-19: qty 120, 30d supply, fill #0

## 2022-04-22 ENCOUNTER — Other Ambulatory Visit (HOSPITAL_COMMUNITY): Payer: Self-pay

## 2022-04-26 ENCOUNTER — Emergency Department (HOSPITAL_BASED_OUTPATIENT_CLINIC_OR_DEPARTMENT_OTHER)
Admission: EM | Admit: 2022-04-26 | Discharge: 2022-04-26 | Disposition: A | Discharge status: Home / Self Care | Payer: Commercial Managed Care - HMO | Attending: Emergency Medicine | Admitting: Emergency Medicine

## 2022-04-26 ENCOUNTER — Encounter (HOSPITAL_BASED_OUTPATIENT_CLINIC_OR_DEPARTMENT_OTHER): Payer: Self-pay | Admitting: Emergency Medicine

## 2022-04-26 ENCOUNTER — Other Ambulatory Visit: Payer: Self-pay

## 2022-04-26 ENCOUNTER — Emergency Department (HOSPITAL_BASED_OUTPATIENT_CLINIC_OR_DEPARTMENT_OTHER): Payer: Commercial Managed Care - HMO

## 2022-04-26 DIAGNOSIS — Z7952 Long term (current) use of systemic steroids: Secondary | ICD-10-CM | POA: Insufficient documentation

## 2022-04-26 DIAGNOSIS — N400 Enlarged prostate without lower urinary tract symptoms: Secondary | ICD-10-CM | POA: Insufficient documentation

## 2022-04-26 DIAGNOSIS — I2510 Atherosclerotic heart disease of native coronary artery without angina pectoris: Secondary | ICD-10-CM | POA: Insufficient documentation

## 2022-04-26 DIAGNOSIS — D72829 Elevated white blood cell count, unspecified: Secondary | ICD-10-CM | POA: Insufficient documentation

## 2022-04-26 DIAGNOSIS — B999 Unspecified infectious disease: Secondary | ICD-10-CM | POA: Insufficient documentation

## 2022-04-26 DIAGNOSIS — R7309 Other abnormal glucose: Secondary | ICD-10-CM | POA: Insufficient documentation

## 2022-04-26 DIAGNOSIS — Z8616 Personal history of COVID-19: Secondary | ICD-10-CM | POA: Diagnosis not present

## 2022-04-26 DIAGNOSIS — R197 Diarrhea, unspecified: Secondary | ICD-10-CM | POA: Diagnosis present

## 2022-04-26 DIAGNOSIS — I700 Atherosclerosis of aorta: Secondary | ICD-10-CM | POA: Diagnosis not present

## 2022-04-26 LAB — COMPREHENSIVE METABOLIC PANEL
ALT: 41 U/L (ref 0–44)
AST: 32 U/L (ref 15–41)
Albumin: 3.4 g/dL — ABNORMAL LOW (ref 3.5–5.0)
Alkaline Phosphatase: 52 U/L (ref 38–126)
Anion gap: 8 (ref 5–15)
BUN: 24 mg/dL — ABNORMAL HIGH (ref 6–20)
CO2: 24 mmol/L (ref 22–32)
Calcium: 8.8 mg/dL — ABNORMAL LOW (ref 8.9–10.3)
Chloride: 104 mmol/L (ref 98–111)
Creatinine, Ser: 0.94 mg/dL (ref 0.61–1.24)
GFR, Estimated: >60 mL/min (ref >60)
Glucose, Bld: 148 mg/dL — ABNORMAL HIGH (ref 70–99)
Potassium: 3.8 mmol/L (ref 3.5–5.1)
Sodium: 136 mmol/L (ref 135–145)
Total Bilirubin: 0.6 mg/dL (ref 0.3–1.2)
Total Protein: 6.6 g/dL (ref 6.5–8.1)

## 2022-04-26 LAB — URINALYSIS, ROUTINE W REFLEX MICROSCOPIC
Bilirubin Urine: NEGATIVE
Glucose, UA: NEGATIVE mg/dL
Ketones, ur: NEGATIVE mg/dL
Leukocytes,Ua: NEGATIVE
Nitrite: NEGATIVE
Protein, ur: NEGATIVE mg/dL
Specific Gravity, Urine: >=1.03 (ref 1.005–1.030)
pH: 5.5 (ref 5.0–8.0)

## 2022-04-26 LAB — CBC
HCT: 43.8 % (ref 39.0–52.0)
Hemoglobin: 15.1 g/dL (ref 13.0–17.0)
MCH: 30.7 pg (ref 26.0–34.0)
MCHC: 34.5 g/dL (ref 30.0–36.0)
MCV: 89 fL (ref 80.0–100.0)
Platelets: 273 10*3/uL (ref 150–400)
RBC: 4.92 MIL/uL (ref 4.22–5.81)
RDW: 14.1 % (ref 11.5–15.5)
WBC: 14 10*3/uL — ABNORMAL HIGH (ref 4.0–10.5)
nRBC: 0 % (ref 0.0–0.2)

## 2022-04-26 LAB — C DIFFICILE QUICK SCREEN W PCR REFLEX
C Diff antigen: NEGATIVE
C Diff interpretation: NOT DETECTED
C Diff toxin: NEGATIVE

## 2022-04-26 LAB — URINALYSIS, MICROSCOPIC (REFLEX)

## 2022-04-26 LAB — LIPASE, BLOOD: Lipase: 45 U/L (ref 11–51)

## 2022-04-26 MED ORDER — DICYCLOMINE HCL 20 MG PO TABS: 20.0000 mg | ORAL_TABLET | Freq: Two times a day (BID) | ORAL | 0 refills | Status: AC

## 2022-04-26 MED ORDER — IOHEXOL 300 MG/ML  SOLN
100.0000 mL | Freq: Once | INTRAMUSCULAR | Status: AC | PRN
  Administered 2022-04-26: 100 mL via INTRAVENOUS

## 2022-04-26 MED ORDER — SODIUM CHLORIDE 0.9 % IV BOLUS
1000.0000 mL | Freq: Once | INTRAVENOUS | Status: AC
  Administered 2022-04-26: 1000 mL via INTRAVENOUS

## 2022-04-26 MED ORDER — AMOXICILLIN-POT CLAVULANATE 875-125 MG PO TABS: 1.0000 | ORAL_TABLET | Freq: Two times a day (BID) | ORAL | 0 refills | Status: AC

## 2022-04-26 MED ORDER — ONDANSETRON HCL 4 MG/2ML IJ SOLN
4.0000 mg | Freq: Once | INTRAMUSCULAR | Status: AC
  Administered 2022-04-26: 4 mg via INTRAVENOUS
  Filled 2022-04-26: qty 2

## 2022-04-26 NOTE — ED Provider Notes (Signed)
[facility] EMERGENCY DEPARTMENT Provider Note   CSN: [id number] Arrival date & time: 04/26/22  1210     History  Chief Complaint  Patient presents with   Diarrhea    Connor Mcpherson is a 51 y.o. male who presents with with over 1 month of bloody voluminous liquid diarrhea.  He has a history of ulcerative colitis.  Patient states he was released from incarceration about a month ago.  He has not had a Humira injection since March.  He is he got COVID is soon as he was released and started having some deep diarrhea at that time and since then has had constant liquid diarrhea, tenesmus, blood in his stool.  He was seen here previously and started on prednisone.  He saw Dr. Elnoria Howard this pastweek and had his prednisone increased to 50 mg daily.  He has had no relief in his symptoms.  He states this is never happened in the past with ulcerative colitis.  When he spoke with Dr. Elnoria Howard he was concerned that he might have something like C. difficile colitis and recommended that he come back for further evaluation.  Patient denies vomiting.  He has chronic abdominal pain in the lower parts of his abdomen since onset of the diarrhea and has been having tenesmus and distention.  He states that the bleeding is scant.  He denies fevers or chills.  He has not had any recent foreign travel, contacts with similar symptoms, ingestion of suspicious foods.  The history is provided by the patient.  Diarrhea      Home Medications Prior to Admission medications   Medication Sig Start Date End Date Taking? Authorizing Provider  hyoscyamine (LEVSIN SL) 0.125 MG SL tablet Place 2 tablets (0.25 mg total) under the tongue every 4 (four) hours as needed for cramping. 10/15/12   Rama, Maryruth Bun, MD  mesalamine (CANASA) 1000 MG suppository Place 1 suppository (1,000 mg total) rectally at bedtime as needed. Use as needed for UC flare. 10/15/12   Rama, Maryruth Bun, MD  mesalamine (PENTASA) 250 MG CR capsule Take 4  capsules (1,000 mg total) by mouth 4 (four) times daily. 10/15/12   Rama, Maryruth Bun, MD  Multiple Vitamin (MULTIVITAMIN) tablet Take 1 tablet by mouth daily.    [provider]  predniSONE (DELTASONE) 10 MG tablet Take 4 tablets (40 mg total) by mouth daily. 04/18/22         Allergies    Patient has no known allergies.    Review of Systems   Review of Systems  Gastrointestinal:  Positive for diarrhea.    Physical Exam Updated Vital Signs BP 137/86   Pulse 61   Temp 98.2 F (36.8 C) (Oral)   Resp 16   Ht 5\' 8"  (1.727 m)   Wt 90.7 kg   SpO2 99%   BMI 30.41 kg/m  Physical Exam Vitals and nursing note reviewed.  Constitutional:      General: He is not in acute distress.    Appearance: He is well-developed. He is not diaphoretic.  HENT:     Head: Normocephalic and atraumatic.  Eyes:     General: No scleral icterus.    Conjunctiva/sclera: Conjunctivae normal.  Cardiovascular:     Rate and Rhythm: Normal rate and regular rhythm.     Heart sounds: Normal heart sounds.  Pulmonary:     Effort: Pulmonary effort is normal. No respiratory distress.     Breath sounds: Normal breath sounds.  Abdominal:  General: There is distension.     Palpations: Abdomen is soft.     Tenderness: There is abdominal tenderness in the right lower quadrant, suprapubic area and left lower quadrant.  Musculoskeletal:     Cervical back: Normal range of motion and neck supple.  Skin:    General: Skin is warm and dry.  Neurological:     Mental Status: He is alert.  Psychiatric:        Behavior: Behavior normal.     ED Results / Procedures / Treatments   Labs (all labs ordered are listed, but only abnormal results are displayed) Labs Reviewed  COMPREHENSIVE METABOLIC PANEL - Abnormal; Notable for the following components:      Result Value   Glucose, Bld 148 (*)    BUN 24 (*)    Calcium 8.8 (*)    Albumin 3.4 (*)    All other components within normal limits  CBC - Abnormal;  Notable for the following components:   WBC 14.0 (*)    All other components within normal limits  URINALYSIS, ROUTINE W REFLEX MICROSCOPIC - Abnormal; Notable for the following components:   Hgb urine dipstick TRACE (*)    All other components within normal limits  URINALYSIS, MICROSCOPIC (REFLEX) - Abnormal; Notable for the following components:   Bacteria, UA RARE (*)    All other components within normal limits  GASTROINTESTINAL PANEL BY PCR, STOOL (REPLACES STOOL CULTURE)  C DIFFICILE QUICK SCREEN W PCR REFLEX    LIPASE, BLOOD    EKG None  Radiology No results found.  Procedures Procedures    Medications Ordered in ED Medications  sodium chloride 0.9 % bolus 1,000 mL (1,000 mLs Intravenous New Bag/Given 04/26/22 1350)  iohexol (OMNIPAQUE) 300 MG/ML solution 100 mL (100 mLs Intravenous Contrast Given 04/26/22 1403)    ED Course/ Medical Decision Making/ A&P                           Medical Decision Making This patient presents to the ED for concern of diarrhea, this involves an extensive number of treatment options, and is a complaint that carries with it a high risk of complications and morbidity.  The differential diagnosis includes The differential diagnosis of diarrhea includes but is not limited to Viral- norovirus/rotavirus; Bacterial-Campylobacter,Shigella, Salmonella, Escherichia coli, E. coli 0157:H7, Yersinia enterocolitica, Vibrio cholerae, Clostridium difficile. Parasitic- Giardia lamblia, Cryptosporidium,Entamoeba histolytica,Cyclospora, Microsporidium. Toxin- Staphylococcus aureus, Bacillus cereus. Noninfectious causes include GI Bleed, Appendicitis, Mesenteric Ischemia, Diverticulitis, Adrenal Crisis, Thyroid Storm, Toxicologic exposures, Antibiotic or drug-associated, inflammatory bowel disease.     Co morbidities that complicate the patient evaluation       ulcerative colitis   Additional history obtained:  Additional history obtained from wife  at bedside External records from outside source obtained and reviewed including OP Gastro records   Lab Tests:  I Ordered, and personally interpreted labs.  The pertinent results include:  elevated wbc,  and glucose level Most likely due to extended steroid use   Imaging Studies ordered:  I ordered imaging studies including ct abd /pelvis I independently visualized and interpreted imaging which showed inflammatory /infectious property in the rectum I agree with the radiologist interpretation   Cardiac Monitoring:       The patient was maintained on a cardiac monitor.  I personally viewed and interpreted the cardiac monitored which showed an underlying rhythm of: nsr   Medicines ordered and prescription drug management:  I ordered medication including  fluids/ zofran for dehydration Reevaluation of the patient after these medicines showed that the patient staryed the same I have reviewed the patients home medicines and have made adjustments as needed   Test Considered:          Critical Interventions:             Problem List / ED Course:       (R19.7) Diarrhea, unspecified type  (primary encounter diagnosis)  (B99.9) Intra-abdominal infection     Reevaluation:  After the interventions noted above, I reevaluated the patient and found that they have :improved   Social Determinants of Health:       established care with GI   Dispostion:  After consideration of the diagnostic results and the patients response to treatment, I feel that the patent would benefit from discharge with anitbiotics.    Amount and/or Complexity of Data Reviewed Labs: ordered. Radiology: ordered.  Risk Prescription drug management.           Final Clinical Impression(s) / ED Diagnoses Final diagnoses:  None    Rx / DC Orders ED Discharge Orders     None         Margarita Mail, PA-C 04/28/22 [place], [place], DO 04/28/22 1525

## 2022-04-26 NOTE — Discharge Instructions (Addendum)
Get help right away if: You have chest pain. You feel extremely weak or you faint. The blood in your diarrhea increases or turns a different color. You vomit and the vomit is bloody or looks black. You have persistent diarrhea. You have severe pain, cramping, or bloating in your abdomen. You have trouble breathing or you are breathing very quickly. Your heart is beating very quickly. Your skin feels cold and clammy. You feel confused. You have signs of dehydration, such as: Dark urine, very little urine, or no urine. Cracked lips. Dry mouth. Sunken eyes. Sleepiness. Weakness. 

## 2022-04-26 NOTE — ED Triage Notes (Signed)
Patient presents to ED via POV from home. Here with 1 month history of abdominal pain and diarrhea. History of ulcerative colitis.

## 2022-04-27 ENCOUNTER — Other Ambulatory Visit (HOSPITAL_COMMUNITY): Payer: Self-pay

## 2022-04-27 LAB — GASTROINTESTINAL PANEL BY PCR, STOOL (REPLACES STOOL CULTURE)

## 2022-04-27 MED ORDER — MESALAMINE 800 MG PO TBEC
1600.0000 mg | DELAYED_RELEASE_TABLET | Freq: Three times a day (TID) | ORAL | 12 refills | Status: AC
  Filled 2022-04-27: qty 180, 30d supply, fill #0

## 2022-04-27 MED ORDER — MESALAMINE 1.2 G PO TBEC
4.8000 g | DELAYED_RELEASE_TABLET | Freq: Every day | ORAL | 5 refills | Status: AC
  Filled 2022-04-27: qty 120, 30d supply, fill #0

## 2022-04-27 MED ORDER — MESALAMINE 1000 MG RE SUPP
1000.0000 mg | Freq: Every day | RECTAL | 5 refills | Status: AC
  Filled 2022-04-27 (×2): qty 30, 30d supply, fill #0

## 2022-04-28 ENCOUNTER — Other Ambulatory Visit (HOSPITAL_COMMUNITY): Payer: Self-pay

## 2022-04-28 MED ORDER — MELOXICAM 15 MG PO TABS
15.0000 mg | ORAL_TABLET | Freq: Every day | ORAL | 3 refills | Status: AC
  Filled 2022-04-28: qty 30, 30d supply, fill #0

## 2022-04-28 MED ORDER — ROSUVASTATIN CALCIUM 20 MG PO TABS
20.0000 mg | ORAL_TABLET | Freq: Every day | ORAL | 3 refills | Status: AC
  Filled 2022-04-28: qty 30, 30d supply, fill #0

## 2022-04-28 MED ORDER — VAZALORE 81 MG PO CAPS: 81.0000 mg | ORAL_CAPSULE | Freq: Every day | ORAL | 3 refills | Status: DC

## 2022-04-28 MED ORDER — METOPROLOL SUCCINATE ER 25 MG PO TB24
25.0000 mg | ORAL_TABLET | Freq: Every day | ORAL | 3 refills | Status: AC
  Filled 2022-04-28: qty 30, 30d supply, fill #0

## 2022-04-29 ENCOUNTER — Other Ambulatory Visit (HOSPITAL_COMMUNITY): Payer: Self-pay

## 2022-06-02 ENCOUNTER — Encounter (HOSPITAL_BASED_OUTPATIENT_CLINIC_OR_DEPARTMENT_OTHER): Payer: Self-pay

## 2022-06-02 ENCOUNTER — Emergency Department (HOSPITAL_BASED_OUTPATIENT_CLINIC_OR_DEPARTMENT_OTHER): Payer: Commercial Managed Care - HMO

## 2022-06-02 ENCOUNTER — Emergency Department (HOSPITAL_BASED_OUTPATIENT_CLINIC_OR_DEPARTMENT_OTHER)
Admission: EM | Admit: 2022-06-02 | Discharge: 2022-06-02 | Disposition: A | Discharge status: Home / Self Care | Payer: Commercial Managed Care - HMO | Attending: Emergency Medicine | Admitting: Emergency Medicine

## 2022-06-02 ENCOUNTER — Other Ambulatory Visit: Payer: Self-pay

## 2022-06-02 DIAGNOSIS — R1084 Generalized abdominal pain: Secondary | ICD-10-CM | POA: Insufficient documentation

## 2022-06-02 DIAGNOSIS — R197 Diarrhea, unspecified: Secondary | ICD-10-CM | POA: Diagnosis present

## 2022-06-02 DIAGNOSIS — R079 Chest pain, unspecified: Secondary | ICD-10-CM | POA: Diagnosis not present

## 2022-06-02 LAB — COMPREHENSIVE METABOLIC PANEL
ALT: 43 U/L (ref 0–44)
AST: 34 U/L (ref 15–41)
Albumin: 3.2 g/dL — ABNORMAL LOW (ref 3.5–5.0)
Alkaline Phosphatase: 46 U/L (ref 38–126)
Anion gap: 5 (ref 5–15)
BUN: 23 mg/dL — ABNORMAL HIGH (ref 6–20)
CO2: 29 mmol/L (ref 22–32)
Calcium: 8.4 mg/dL — ABNORMAL LOW (ref 8.9–10.3)
Chloride: 105 mmol/L (ref 98–111)
Creatinine, Ser: 1.05 mg/dL (ref 0.61–1.24)
GFR, Estimated: >60 mL/min (ref >60)
Glucose, Bld: 81 mg/dL (ref 70–99)
Potassium: 3.4 mmol/L — ABNORMAL LOW (ref 3.5–5.1)
Sodium: 139 mmol/L (ref 135–145)
Total Bilirubin: 0.8 mg/dL (ref 0.3–1.2)
Total Protein: 6.4 g/dL — ABNORMAL LOW (ref 6.5–8.1)

## 2022-06-02 LAB — TROPONIN I (HIGH SENSITIVITY)
Troponin I (High Sensitivity): 20 ng/L — ABNORMAL HIGH (ref <18)
Troponin I (High Sensitivity): 22 ng/L — ABNORMAL HIGH (ref <18)

## 2022-06-02 LAB — URINALYSIS, ROUTINE W REFLEX MICROSCOPIC
Bilirubin Urine: NEGATIVE
Glucose, UA: NEGATIVE mg/dL
Hgb urine dipstick: NEGATIVE
Ketones, ur: NEGATIVE mg/dL
Leukocytes,Ua: NEGATIVE
Nitrite: NEGATIVE
Protein, ur: NEGATIVE mg/dL
Specific Gravity, Urine: >=1.03 (ref 1.005–1.030)
pH: 5.5 (ref 5.0–8.0)

## 2022-06-02 LAB — CBC
HCT: 45.4 % (ref 39.0–52.0)
Hemoglobin: 15.1 g/dL (ref 13.0–17.0)
MCH: 30.5 pg (ref 26.0–34.0)
MCHC: 33.3 g/dL (ref 30.0–36.0)
MCV: 91.7 fL (ref 80.0–100.0)
Platelets: 277 10*3/uL (ref 150–400)
RBC: 4.95 MIL/uL (ref 4.22–5.81)
RDW: 14.2 % (ref 11.5–15.5)
WBC: 9.9 10*3/uL (ref 4.0–10.5)
nRBC: 0 % (ref 0.0–0.2)

## 2022-06-02 LAB — LIPASE, BLOOD: Lipase: 45 U/L (ref 11–51)

## 2022-06-02 MED ORDER — DICYCLOMINE HCL 10 MG/ML IM SOLN
20.0000 mg | Freq: Once | INTRAMUSCULAR | Status: AC
  Administered 2022-06-02: 20 mg via INTRAMUSCULAR
  Filled 2022-06-02: qty 2

## 2022-06-02 MED ORDER — IOHEXOL 300 MG/ML  SOLN
100.0000 mL | Freq: Once | INTRAMUSCULAR | Status: AC | PRN
  Administered 2022-06-02: 100 mL via INTRAVENOUS

## 2022-06-02 MED ORDER — LACTATED RINGERS IV BOLUS
1000.0000 mL | Freq: Once | INTRAVENOUS | Status: AC
  Administered 2022-06-02: 1000 mL via INTRAVENOUS

## 2022-06-02 MED ORDER — LOPERAMIDE HCL 2 MG PO CAPS: 2.0000 mg | ORAL_CAPSULE | Freq: Four times a day (QID) | ORAL | 0 refills | Status: AC | PRN

## 2022-06-02 MED ORDER — POTASSIUM CHLORIDE CRYS ER 20 MEQ PO TBCR
40.0000 meq | EXTENDED_RELEASE_TABLET | Freq: Once | ORAL | Status: AC
  Administered 2022-06-02: 40 meq via ORAL
  Filled 2022-06-02: qty 2

## 2022-06-02 NOTE — ED Provider Notes (Signed)
[facility] EMERGENCY DEPARTMENT Provider Note   CSN: [id number] Arrival date & time: 06/02/22  1136     History  Chief Complaint  Patient presents with   Abdominal Pain    Connor Mcpherson is a 51 y.o. male.  51 year old male with past medical history significant for ulcerative colitis presents today for evaluation of ongoing bloody diarrhea.  States he was recently released from incarceration in September.  He states he was previously on Humira which he self discontinued in July due to cost reasons.  He states few days prior to being released from prison he was diagnosed with COVID.  He states since shortly afterwards he developed his gastrointestinal symptoms.  He was evaluated for this on 10/10 with bloody diarrhea.  He states he was seen by Dr. Elnoria Howard and restarted on Humira.  He has had 2 doses so far and has his third dose scheduled for tomorrow.  He denies fever, chills, other changes in his symptoms.  At that time he was diagnosed with colitis and started on Augmentin which he completed the course of.  Again he denies any change in his symptoms, other than slight progression in his episodes of diarrhea.  He states the blood is not enough to cover the toilet bowl.  He states this just some isolated drops and he notices some upon wiping.  Denies emesis.  Does have lower abdominal pain which is unchanged from previous.  He states he reached out to his GI office who recommended he come into the emergency room for evaluation.  The history is provided by the patient. No language interpreter was used.       Home Medications Prior to Admission medications   Medication Sig Start Date End Date Taking? Authorizing Provider  HUMIRA PEN 40 MG/0.4ML PNKT  05/09/22  Yes [provider]  sildenafil (VIAGRA) 50 MG tablet Take 1 tablet by mouth daily as needed. 05/19/22  Yes [provider]  Adalimumab (HUMIRA) 40 MG/0.8ML PSKT Inject into the skin.    [provider]  amoxicillin-clavulanate (AUGMENTIN) 875-125 MG tablet Take 1 tablet by mouth every 12 (twelve) hours. 04/26/22   Harris, Cammy Copa, PA-C  dicyclomine (BENTYL) 20 MG tablet Take 1 tablet (20 mg total) by mouth 2 (two) times daily. 04/26/22   Harris, Cammy Copa, PA-C  hyoscyamine (LEVSIN SL) 0.125 MG SL tablet Place 2 tablets (0.25 mg total) under the tongue every 4 (four) hours as needed for cramping. 10/15/12   Rama, Maryruth Bun, MD  meloxicam (MOBIC) 15 MG tablet Take 1 tablet by mouth once daily 04/28/22     mesalamine (CANASA) 1000 MG suppository Place 1 suppository (1,000 mg total) rectally at bedtime as needed. Use as needed for UC flare. 10/15/12   Rama, Maryruth Bun, MD  mesalamine (CANASA) 1000 MG suppository Place 1 suppository (1,000 mg total) rectally at bedtime. 04/27/22     mesalamine (LIALDA) 1.2 g EC tablet Take 4 tablets (4.8 g total) by mouth daily. 04/27/22     mesalamine (PENTASA) 250 MG CR capsule Take 4 capsules (1,000 mg total) by mouth 4 (four) times daily. 10/15/12   Rama, Maryruth Bun, MD  Mesalamine 800 MG TBEC Take 2 tablets (1,600 mg total) by mouth 3 (three) times daily. 04/27/22     metoprolol succinate (TOPROL-XL) 25 MG 24 hr tablet Take 1 tablet by mouth once daily 04/28/22     Multiple Vitamin (MULTIVITAMIN) tablet Take 1 tablet by mouth daily.    [provider]  predniSONE (DELTASONE) 10 MG tablet Take 4 tablets (40 mg total) by mouth daily. 04/18/22     rosuvastatin (CRESTOR) 20 MG tablet Take 1 tablet by mouth once daily 04/28/22         Allergies    Patient has no known allergies.    Review of Systems   Review of Systems  Constitutional:  Negative for chills and fever.  Gastrointestinal:  Positive for abdominal pain, blood in stool and diarrhea. Negative for nausea and vomiting.  Genitourinary:  Negative for dysuria and hematuria.  Neurological:  Negative for light-headedness.  All other systems reviewed and are negative.   Physical  Exam Updated Vital Signs BP (!) 135/95 (BP Location: Right Arm)   Pulse (!) 58   Temp 98.1 F (36.7 C) (Oral)   Resp 18   Ht 5\' 8"  (1.727 m)   Wt 90.7 kg   SpO2 96%   BMI 30.41 kg/m  Physical Exam Vitals and nursing note reviewed.  Constitutional:      General: He is not in acute distress.    Appearance: Normal appearance. He is not ill-appearing.  HENT:     Head: Normocephalic and atraumatic.     Nose: Nose normal.  Eyes:     General: No scleral icterus.    Extraocular Movements: Extraocular movements intact.     Conjunctiva/sclera: Conjunctivae normal.  Cardiovascular:     Rate and Rhythm: Normal rate and regular rhythm.     Pulses: Normal pulses.  Pulmonary:     Effort: Pulmonary effort is normal. No respiratory distress.     Breath sounds: Normal breath sounds. No wheezing or rales.  Abdominal:     General: There is no distension.     Tenderness: There is abdominal tenderness (LLQ). There is no right CVA tenderness, left CVA tenderness, guarding or rebound.  Musculoskeletal:        General: Normal range of motion.     Cervical back: Normal range of motion.  Skin:    General: Skin is warm and dry.  Neurological:     General: No focal deficit present.     Mental Status: He is alert. Mental status is at baseline.     ED Results / Procedures / Treatments   Labs (all labs ordered are listed, but only abnormal results are displayed) Labs Reviewed  COMPREHENSIVE METABOLIC PANEL - Abnormal; Notable for the following components:      Result Value   Potassium 3.4 (*)    BUN 23 (*)    Calcium 8.4 (*)    Total Protein 6.4 (*)    Albumin 3.2 (*)    All other components within normal limits  URINALYSIS, ROUTINE W REFLEX MICROSCOPIC - Abnormal; Notable for the following components:   Color, Urine AMBER (*)    All other components within normal limits  LIPASE, BLOOD  CBC    EKG None  Radiology No results found.  Procedures Procedures    Medications Ordered  in ED Medications  lactated ringers bolus 1,000 mL (1,000 mLs Intravenous New Bag/Given 06/02/22 1550)  dicyclomine (BENTYL) injection 20 mg (20 mg Intramuscular Given 06/02/22 1552)    ED Course/ Medical Decision Making/ A&P                           Medical Decision Making Amount and/or Complexity of Data Reviewed Labs: ordered. Radiology: ordered.  Risk Prescription drug management.   Medical Decision Making / ED Course  This patient presents to the ED for concern of bloody diarrhea, abdominal pain, this involves an extensive number of treatment options, and is a complaint that carries with it a high risk of complications and morbidity.  The differential diagnosis includes ulcerative colitis flareup, Crohn's disease, gastroenteritis, colitis, diverticulitis  MDM: 51 year old male with past medical history as noted above presents today for evaluation of bloody diarrhea.  This has been ongoing for over a month.  He was previously evaluated in this emergency room.  At that time there was CT evidence of colitis and he was treated with Augmentin.  He has not followed up with GI since then.  He was restarted on Humira and has his third dose due tomorrow.  He appears to be hemodynamically stable.  He is without tachycardia, hypotension.  Does not report significant blood with each episode of BM.  He does appear stable.  States he reached out to his gastroenterologist office and they recommended he come into emergency room for evaluation.  He did have a recent CT scan done.  He request to have another one done today.  Will provide fluid bolus, Bentyl and obtain CT abdomen pelvis with contrast. He also complains of right knee pain.  This was previously evaluated by [facility].  He denies any injury.  He has good range of motion.  No tenderness to palpation.  At he is able ambulate without difficulty.  No acute process suspected.  No overlying erythema warmth or swelling.  No suspicion for septic  joint.  Normal WBC. He will follow up with [facility]. CBC without leukocytosis or anemia.  Hemoglobin of 15.1.  UA without evidence of UTI.  CMP shows potassium of 3.4, preserved renal function.  Lipase within normal limits. Wife presented later and stated patient has also been having chest pain.  He does have history of CAD.  We will add on ACS work-up.  Currently chest pain-free. CT abdomen pelvis shows changes consistent with ulcerative colitis however no acute or significant change since most recent.  Particularly in setting of normal WBC.  Chest x-ray is unremarkable.  Initial troponin of 20, repeat 22.  No significant delta.  No suspicion for ACS.  Will give cardiology referral.  Potassium of 3.4.  Will give 40 mEq prior to discharge.  Patient is otherwise stable for discharge.  Will give Lomotil for diarrhea.  During previous emergency room eval he had C. difficile, and GI stool pathogen test which was unremarkable.  Discussed adequate hydration.  Follow-up with gastroenterologist.  Compliance with his Humira which should be due tomorrow.  Patient is appropriate for discharge.  Discharged in stable condition.  Return precaution discussed.   Lab Tests: -I ordered, reviewed, and interpreted labs.   The pertinent results include:   Labs Reviewed  COMPREHENSIVE METABOLIC PANEL - Abnormal; Notable for the following components:      Result Value   Potassium 3.4 (*)    BUN 23 (*)    Calcium 8.4 (*)    Total Protein 6.4 (*)    Albumin 3.2 (*)    All other components within normal limits  URINALYSIS, ROUTINE W REFLEX MICROSCOPIC - Abnormal; Notable for the following components:   Color, Urine AMBER (*)    All other components within normal limits  LIPASE, BLOOD  CBC      EKG  EKG Interpretation  Date/Time:    Ventricular Rate:    PR Interval:    QRS Duration:   QT Interval:    QTC  Calculation:   R Axis:     Text Interpretation:           Imaging Studies ordered: I ordered  imaging studies including chest x-ray I independently visualized and interpreted imaging. I agree with the radiologist interpretation   Medicines ordered and prescription drug management: Meds ordered this encounter  Medications   lactated ringers bolus 1,000 mL   dicyclomine (BENTYL) injection 20 mg   iohexol (OMNIPAQUE) 300 MG/ML solution 100 mL    -I have reviewed the patients home medicines and have made adjustments as needed  Reevaluation: After the interventions noted above, I reevaluated the patient and found that they have :stayed the same  Co morbidities that complicate the patient evaluation  Past Medical History:  Diagnosis Date   Diverticulitis    Kidney stone    Ulcerative colitis       Dispostion: Patient is appropriate for discharge.  Discharged in stable condition.  Return precaution discussed.  Patient voices understanding and is in agreement with plan.   Final Clinical Impression(s) / ED Diagnoses Final diagnoses:  Generalized abdominal pain  Diarrhea, unspecified type  Chest pain, unspecified type    Rx / DC Orders ED Discharge Orders          Ordered    loperamide (IMODIUM) 2 MG capsule  4 times daily PRN        06/02/22 2019    Ambulatory referral to Cardiology        06/02/22 2020              Marita [place], PA-C 06/02/22 2021    Charlynne Pander, MD 06/02/22 [phone]

## 2022-06-02 NOTE — ED Triage Notes (Addendum)
Pt reports continued abdominal pain and diarrhea since last month visit due to ulcerative colitis. Completed antibiotics and steroids 2 weeks. Conts with uncontrollable bloody diarrhea

## 2022-06-02 NOTE — Discharge Instructions (Addendum)
Your work-up today was overall reassuring.  No significant change on your CT abdomen.  No signs of an acute heart attack at this point given your chest pain.  Given your history of coronary artery disease I have given you follow-up with a cardiologist.  Please take your Humira at the next scheduled dose which she said was tomorrow.  Please call your gastroenterologist to schedule a follow-up appointment.  For any concerning symptoms please return to the emergency room for evaluation.  I have given you information for loperamide

## 2022-06-03 ENCOUNTER — Other Ambulatory Visit (HOSPITAL_COMMUNITY): Payer: Self-pay

## 2022-06-03 MED ORDER — PREDNISONE 10 MG PO TABS
40.0000 mg | ORAL_TABLET | Freq: Every day | ORAL | 0 refills | Status: AC
  Filled 2022-06-03: qty 120, 30d supply, fill #0
